# Patient Record
Sex: Male | Born: 1935 | Race: White | Hispanic: No | Marital: Married | Smoking: Current every day smoker
Health system: Southern US, Community
[De-identification: ages and names within clinical notes are randomized; demographics above are authoritative.]

## PROBLEM LIST (undated history)

## (undated) DIAGNOSIS — N189 Chronic kidney disease, unspecified: Secondary | ICD-10-CM

## (undated) DIAGNOSIS — N289 Disorder of kidney and ureter, unspecified: Secondary | ICD-10-CM

## (undated) DIAGNOSIS — G4730 Sleep apnea, unspecified: Secondary | ICD-10-CM

## (undated) DIAGNOSIS — D649 Anemia, unspecified: Secondary | ICD-10-CM

## (undated) DIAGNOSIS — E119 Type 2 diabetes mellitus without complications: Secondary | ICD-10-CM

## (undated) DIAGNOSIS — J449 Chronic obstructive pulmonary disease, unspecified: Secondary | ICD-10-CM

## (undated) DIAGNOSIS — IMO0001 Reserved for inherently not codable concepts without codable children: Secondary | ICD-10-CM

## (undated) DIAGNOSIS — J189 Pneumonia, unspecified organism: Secondary | ICD-10-CM

## (undated) DIAGNOSIS — E611 Iron deficiency: Secondary | ICD-10-CM

## (undated) DIAGNOSIS — Z8719 Personal history of other diseases of the digestive system: Secondary | ICD-10-CM

## (undated) DIAGNOSIS — I10 Essential (primary) hypertension: Secondary | ICD-10-CM

## (undated) DIAGNOSIS — C801 Malignant (primary) neoplasm, unspecified: Secondary | ICD-10-CM

## (undated) HISTORY — PX: KIDNEY SURGERY: SHX687

## (undated) HISTORY — PX: EYE SURGERY: SHX253

## (undated) HISTORY — PX: COLONOSCOPY: SHX174

---

## 2008-07-26 ENCOUNTER — Encounter (INDEPENDENT_AMBULATORY_CARE_PROVIDER_SITE_OTHER): Payer: Self-pay | Admitting: Interventional Radiology

## 2008-07-26 ENCOUNTER — Ambulatory Visit (HOSPITAL_COMMUNITY): Admission: RE | Admit: 2008-07-26 | Discharge: 2008-07-26 | Payer: Self-pay | Admitting: Urology

## 2010-06-19 ENCOUNTER — Inpatient Hospital Stay (HOSPITAL_COMMUNITY): Admission: EM | Admit: 2010-06-19 | Discharge: 2010-06-23 | Payer: Self-pay | Admitting: Emergency Medicine

## 2010-06-20 ENCOUNTER — Encounter (INDEPENDENT_AMBULATORY_CARE_PROVIDER_SITE_OTHER): Payer: Self-pay | Admitting: Internal Medicine

## 2011-03-15 LAB — LIPID PANEL
Cholesterol: 108 mg/dL (ref 0–200)
HDL: 28 mg/dL — ABNORMAL LOW (ref >39)
LDL Cholesterol: 36 mg/dL (ref 0–99)
Total CHOL/HDL Ratio: 3.9 RATIO
Triglycerides: 222 mg/dL — ABNORMAL HIGH (ref <150)

## 2011-03-15 LAB — GLUCOSE, CAPILLARY
Glucose-Capillary: 179 mg/dL — ABNORMAL HIGH (ref 70–99)
Glucose-Capillary: 199 mg/dL — ABNORMAL HIGH (ref 70–99)
Glucose-Capillary: 214 mg/dL — ABNORMAL HIGH (ref 70–99)
Glucose-Capillary: 222 mg/dL — ABNORMAL HIGH (ref 70–99)
Glucose-Capillary: 254 mg/dL — ABNORMAL HIGH (ref 70–99)
Glucose-Capillary: 267 mg/dL — ABNORMAL HIGH (ref 70–99)
Glucose-Capillary: 279 mg/dL — ABNORMAL HIGH (ref 70–99)
Glucose-Capillary: 298 mg/dL — ABNORMAL HIGH (ref 70–99)
Glucose-Capillary: 306 mg/dL — ABNORMAL HIGH (ref 70–99)
Glucose-Capillary: 311 mg/dL — ABNORMAL HIGH (ref 70–99)
Glucose-Capillary: 312 mg/dL — ABNORMAL HIGH (ref 70–99)
Glucose-Capillary: 327 mg/dL — ABNORMAL HIGH (ref 70–99)
Glucose-Capillary: 364 mg/dL — ABNORMAL HIGH (ref 70–99)
Glucose-Capillary: 368 mg/dL — ABNORMAL HIGH (ref 70–99)
Glucose-Capillary: 374 mg/dL — ABNORMAL HIGH (ref 70–99)
Glucose-Capillary: 374 mg/dL — ABNORMAL HIGH (ref 70–99)
Glucose-Capillary: 403 mg/dL — ABNORMAL HIGH (ref 70–99)
Glucose-Capillary: 425 mg/dL — ABNORMAL HIGH (ref 70–99)
Glucose-Capillary: 428 mg/dL — ABNORMAL HIGH (ref 70–99)
Glucose-Capillary: 430 mg/dL — ABNORMAL HIGH (ref 70–99)

## 2011-03-15 LAB — HEMOGLOBIN A1C
Hgb A1c MFr Bld: 9.7 % — ABNORMAL HIGH
Mean Plasma Glucose: 232 mg/dL — ABNORMAL HIGH

## 2011-03-15 LAB — COMPREHENSIVE METABOLIC PANEL WITH GFR
ALT: 25 U/L (ref 0–53)
AST: 20 U/L (ref 0–37)
Albumin: 2.6 g/dL — ABNORMAL LOW (ref 3.5–5.2)
Alkaline Phosphatase: 53 U/L (ref 39–117)
BUN: 34 mg/dL — ABNORMAL HIGH (ref 6–23)
CO2: 27 meq/L (ref 19–32)
Calcium: 8.6 mg/dL (ref 8.4–10.5)
Chloride: 104 meq/L (ref 96–112)
Creatinine, Ser: 1.98 mg/dL — ABNORMAL HIGH (ref 0.4–1.5)
GFR calc non Af Amer: 33 mL/min — ABNORMAL LOW
Glucose, Bld: 397 mg/dL — ABNORMAL HIGH (ref 70–99)
Potassium: 6 meq/L — ABNORMAL HIGH (ref 3.5–5.1)
Sodium: 136 meq/L (ref 135–145)
Total Bilirubin: 0.4 mg/dL (ref 0.3–1.2)
Total Protein: 6.1 g/dL (ref 6.0–8.3)

## 2011-03-15 LAB — CK TOTAL AND CKMB (NOT AT ARMC)
CK, MB: 2 ng/mL (ref 0.3–4.0)
Relative Index: INVALID (ref 0.0–2.5)
Total CK: 31 U/L (ref 7–232)

## 2011-03-15 LAB — CBC
HCT: 26.4 % — ABNORMAL LOW (ref 39.0–52.0)
HCT: 28.3 % — ABNORMAL LOW (ref 39.0–52.0)
HCT: 29 % — ABNORMAL LOW (ref 39.0–52.0)
Hemoglobin: 8.4 g/dL — ABNORMAL LOW (ref 13.0–17.0)
Hemoglobin: 9 g/dL — ABNORMAL LOW (ref 13.0–17.0)
Hemoglobin: 9.5 g/dL — ABNORMAL LOW (ref 13.0–17.0)
MCH: 24.7 pg — ABNORMAL LOW (ref 26.0–34.0)
MCH: 24.9 pg — ABNORMAL LOW (ref 26.0–34.0)
MCH: 25 pg — ABNORMAL LOW (ref 26.0–34.0)
MCH: 25.5 pg — ABNORMAL LOW (ref 26.0–34.0)
MCHC: 31.5 g/dL (ref 30.0–36.0)
MCHC: 31.7 g/dL (ref 30.0–36.0)
MCHC: 32.8 g/dL (ref 30.0–36.0)
MCV: 77.8 fL — ABNORMAL LOW (ref 78.0–100.0)
MCV: 78.3 fL (ref 78.0–100.0)
MCV: 78.5 fL (ref 78.0–100.0)
MCV: 78.9 fL (ref 78.0–100.0)
Platelets: 237 10*3/uL (ref 150–400)
Platelets: 253 10*3/uL (ref 150–400)
Platelets: 258 10*3/uL (ref 150–400)
Platelets: 320 10*3/uL (ref 150–400)
RBC: 3.34 MIL/uL — ABNORMAL LOW (ref 4.22–5.81)
RBC: 3.61 MIL/uL — ABNORMAL LOW (ref 4.22–5.81)
RBC: 3.73 MIL/uL — ABNORMAL LOW (ref 4.22–5.81)
RDW: 18 % — ABNORMAL HIGH (ref 11.5–15.5)
RDW: 18 % — ABNORMAL HIGH (ref 11.5–15.5)
RDW: 18.2 % — ABNORMAL HIGH (ref 11.5–15.5)
WBC: 13.3 10*3/uL — ABNORMAL HIGH (ref 4.0–10.5)
WBC: 13.9 10*3/uL — ABNORMAL HIGH (ref 4.0–10.5)
WBC: 8.9 10*3/uL (ref 4.0–10.5)

## 2011-03-15 LAB — DIFFERENTIAL
Basophils Absolute: 0 10*3/uL (ref 0.0–0.1)
Basophils Relative: 0 % (ref 0–1)
Basophils Relative: 0 % (ref 0–1)
Eosinophils Absolute: 0 10*3/uL (ref 0.0–0.7)
Eosinophils Absolute: 0.6 10*3/uL (ref 0.0–0.7)
Eosinophils Relative: 0 % (ref 0–5)
Eosinophils Relative: 4 % (ref 0–5)
Lymphocytes Relative: 4 % — ABNORMAL LOW (ref 12–46)
Lymphocytes Relative: 4 % — ABNORMAL LOW (ref 12–46)
Lymphs Abs: 0.6 10*3/uL — ABNORMAL LOW (ref 0.7–4.0)
Lymphs Abs: 0.7 10*3/uL (ref 0.7–4.0)
Monocytes Absolute: 0.1 10*3/uL (ref 0.1–1.0)
Monocytes Absolute: 0.4 10*3/uL (ref 0.1–1.0)
Monocytes Relative: 0 % — ABNORMAL LOW (ref 3–12)
Neutro Abs: 12.7 10*3/uL — ABNORMAL HIGH (ref 1.7–7.7)
Neutrophils Relative %: 95 % — ABNORMAL HIGH (ref 43–77)

## 2011-03-15 LAB — BASIC METABOLIC PANEL
BUN: 36 mg/dL — ABNORMAL HIGH (ref 6–23)
CO2: 24 mEq/L (ref 19–32)
CO2: 25 mEq/L (ref 19–32)
CO2: 27 mEq/L (ref 19–32)
Calcium: 8.5 mg/dL (ref 8.4–10.5)
Chloride: 102 mEq/L (ref 96–112)
Creatinine, Ser: 1.93 mg/dL — ABNORMAL HIGH (ref 0.4–1.5)
GFR calc Af Amer: 41 mL/min — ABNORMAL LOW (ref >60)
GFR calc Af Amer: 42 mL/min — ABNORMAL LOW (ref >60)
GFR calc non Af Amer: 32 mL/min — ABNORMAL LOW (ref >60)
Glucose, Bld: 389 mg/dL — ABNORMAL HIGH (ref 70–99)
Potassium: 4.1 mEq/L (ref 3.5–5.1)
Potassium: 4.8 mEq/L (ref 3.5–5.1)
Sodium: 136 mEq/L (ref 135–145)
Sodium: 138 mEq/L (ref 135–145)

## 2011-03-15 LAB — ABO/RH: ABO/RH(D): A POS

## 2011-03-15 LAB — POCT I-STAT, CHEM 8
BUN: 45 mg/dL — ABNORMAL HIGH (ref 6–23)
Calcium, Ion: 1.18 mmol/L (ref 1.12–1.32)
Chloride: 104 mEq/L (ref 96–112)
Sodium: 131 mEq/L — ABNORMAL LOW (ref 135–145)

## 2011-03-15 LAB — COMPREHENSIVE METABOLIC PANEL
AST: 19 U/L (ref 0–37)
BUN: 42 mg/dL — ABNORMAL HIGH (ref 6–23)
CO2: 23 mEq/L (ref 19–32)
Chloride: 100 mEq/L (ref 96–112)
Creatinine, Ser: 2.1 mg/dL — ABNORMAL HIGH (ref 0.4–1.5)
GFR calc Af Amer: 38 mL/min — ABNORMAL LOW (ref >60)
GFR calc non Af Amer: 31 mL/min — ABNORMAL LOW (ref >60)
Glucose, Bld: 298 mg/dL — ABNORMAL HIGH (ref 70–99)
Total Bilirubin: 0.5 mg/dL (ref 0.3–1.2)

## 2011-03-15 LAB — FOLATE: Folate: 9.6 ng/mL

## 2011-03-15 LAB — IRON AND TIBC
Iron: 13 ug/dL — ABNORMAL LOW (ref 42–135)
Saturation Ratios: 5 % — ABNORMAL LOW (ref 20–55)
TIBC: 246 ug/dL (ref 215–435)
UIBC: 233 ug/dL

## 2011-03-15 LAB — URINE MICROSCOPIC-ADD ON

## 2011-03-15 LAB — POCT CARDIAC MARKERS
CKMB, poc: 1.1 ng/mL (ref 1.0–8.0)
Myoglobin, poc: 136 ng/mL (ref 12–200)
Myoglobin, poc: 197 ng/mL (ref 12–200)
Troponin i, poc: <0.05 ng/mL (ref 0.00–0.09)

## 2011-03-15 LAB — CULTURE, BLOOD (ROUTINE X 2)
Culture: NO GROWTH
Culture: NO GROWTH

## 2011-03-15 LAB — OSMOLALITY, URINE: Osmolality, Ur: 568 mOsm/kg (ref 390–1090)

## 2011-03-15 LAB — SODIUM, URINE, RANDOM: Sodium, Ur: 75 mEq/L

## 2011-03-15 LAB — URINALYSIS, ROUTINE W REFLEX MICROSCOPIC
Leukocytes, UA: NEGATIVE
Nitrite: NEGATIVE
Protein, ur: 30 mg/dL — AB
Specific Gravity, Urine: 1.018 (ref 1.005–1.030)
Urobilinogen, UA: 0.2 mg/dL (ref 0.0–1.0)

## 2011-03-15 LAB — VITAMIN B12: Vitamin B-12: 586 pg/mL (ref 211–911)

## 2011-03-15 LAB — FERRITIN: Ferritin: 103 ng/mL (ref 22–322)

## 2011-03-15 LAB — TSH: TSH: 0.81 u[IU]/mL (ref 0.350–4.500)

## 2011-03-15 LAB — LACTIC ACID, PLASMA: Lactic Acid, Venous: 2.7 mmol/L — ABNORMAL HIGH (ref 0.5–2.2)

## 2011-03-15 LAB — TYPE AND SCREEN

## 2011-03-15 LAB — BLOOD GAS, ARTERIAL
O2 Saturation: 97.7 %
Patient temperature: 98.6
TCO2: 19.6 mmol/L (ref 0–100)

## 2011-03-15 LAB — RETICULOCYTES
RBC.: 3.72 MIL/uL — ABNORMAL LOW (ref 4.22–5.81)
Retic Ct Pct: 1.2 % (ref 0.4–3.1)

## 2011-03-15 LAB — CARDIAC PANEL(CRET KIN+CKTOT+MB+TROPI): Total CK: 39 U/L (ref 7–232)

## 2011-03-15 LAB — URINE CULTURE

## 2011-03-15 LAB — VANCOMYCIN, TROUGH: Vancomycin Tr: 16.6 ug/mL (ref 10.0–20.0)

## 2011-03-15 LAB — BRAIN NATRIURETIC PEPTIDE: Pro B Natriuretic peptide (BNP): <30 pg/mL (ref 0.0–100.0)

## 2011-03-15 LAB — CREATININE, URINE, RANDOM: Creatinine, Urine: 84.4 mg/dL

## 2011-03-15 LAB — MAGNESIUM: Magnesium: 2.3 mg/dL (ref 1.5–2.5)

## 2011-09-25 LAB — CBC
HCT: 37.5 — ABNORMAL LOW
Hemoglobin: 12.5 — ABNORMAL LOW
MCHC: 33.4
MCV: 84.6
Platelets: 230
RBC: 4.44
RDW: 15.4
WBC: 9.2

## 2011-09-25 LAB — PROTIME-INR
INR: 1
Prothrombin Time: 12.9

## 2013-07-03 ENCOUNTER — Other Ambulatory Visit (HOSPITAL_COMMUNITY): Payer: Self-pay | Admitting: *Deleted

## 2013-07-04 ENCOUNTER — Encounter (HOSPITAL_COMMUNITY): Payer: Self-pay

## 2013-08-02 ENCOUNTER — Encounter (HOSPITAL_COMMUNITY)
Admission: RE | Admit: 2013-08-02 | Discharge: 2013-08-02 | Disposition: A | Discharge status: Home / Self Care | Payer: Medicare Other | Source: Ambulatory Visit | Attending: Nephrology | Admitting: Nephrology

## 2013-08-02 DIAGNOSIS — D649 Anemia, unspecified: Secondary | ICD-10-CM | POA: Insufficient documentation

## 2013-08-02 MED ORDER — SODIUM CHLORIDE 0.9 % IV SOLN
1020.0000 mg | Freq: Once | INTRAVENOUS | Status: AC
  Administered 2013-08-02: 1020 mg via INTRAVENOUS
  Filled 2013-08-02: qty 34

## 2015-04-16 ENCOUNTER — Ambulatory Visit (HOSPITAL_COMMUNITY)
Admission: RE | Admit: 2015-04-16 | Discharge: 2015-04-16 | Disposition: A | Discharge status: Home / Self Care | Payer: Medicare HMO | Source: Ambulatory Visit | Attending: Nephrology | Admitting: Nephrology

## 2015-04-16 DIAGNOSIS — N184 Chronic kidney disease, stage 4 (severe): Secondary | ICD-10-CM | POA: Diagnosis not present

## 2015-04-16 DIAGNOSIS — Z5181 Encounter for therapeutic drug level monitoring: Secondary | ICD-10-CM | POA: Diagnosis not present

## 2015-04-16 DIAGNOSIS — Z79899 Other long term (current) drug therapy: Secondary | ICD-10-CM | POA: Diagnosis not present

## 2015-04-16 DIAGNOSIS — D631 Anemia in chronic kidney disease: Secondary | ICD-10-CM | POA: Insufficient documentation

## 2015-04-16 LAB — BASIC METABOLIC PANEL
Anion gap: 10 (ref 5–15)
BUN: 31 mg/dL — AB (ref 6–23)
CALCIUM: 8.7 mg/dL (ref 8.4–10.5)
CO2: 27 mmol/L (ref 19–32)
Chloride: 100 mmol/L (ref 96–112)
Creatinine, Ser: 4.03 mg/dL — ABNORMAL HIGH (ref 0.50–1.35)
GFR calc Af Amer: 15 mL/min — ABNORMAL LOW (ref >90)
GFR calc non Af Amer: 13 mL/min — ABNORMAL LOW (ref >90)
GLUCOSE: 210 mg/dL — AB (ref 70–99)
Potassium: 4.1 mmol/L (ref 3.5–5.1)
Sodium: 137 mmol/L (ref 135–145)

## 2015-04-16 LAB — POCT HEMOGLOBIN-HEMACUE: Hemoglobin: 9.3 g/dL — ABNORMAL LOW (ref 13.0–17.0)

## 2015-04-16 MED ORDER — EPOETIN ALFA 10000 UNIT/ML IJ SOLN
INTRAMUSCULAR | Status: AC
  Filled 2015-04-16: qty 1

## 2015-04-16 MED ORDER — EPOETIN ALFA 10000 UNIT/ML IJ SOLN
10000.0000 [IU] | INTRAMUSCULAR | Status: DC
  Administered 2015-04-16: 10000 [IU] via SUBCUTANEOUS

## 2015-04-19 ENCOUNTER — Ambulatory Visit
Admission: RE | Admit: 2015-04-19 | Discharge: 2015-04-19 | Disposition: A | Discharge status: Home / Self Care | Payer: Medicare HMO | Source: Ambulatory Visit | Attending: Nephrology | Admitting: Nephrology

## 2015-04-19 ENCOUNTER — Other Ambulatory Visit: Payer: Self-pay | Admitting: Nephrology

## 2015-04-19 DIAGNOSIS — N179 Acute kidney failure, unspecified: Secondary | ICD-10-CM

## 2015-04-30 ENCOUNTER — Encounter (HOSPITAL_COMMUNITY)
Admission: RE | Admit: 2015-04-30 | Discharge: 2015-04-30 | Disposition: A | Discharge status: Home / Self Care | Payer: Medicare HMO | Source: Ambulatory Visit | Attending: Nephrology | Admitting: Nephrology

## 2015-04-30 DIAGNOSIS — D631 Anemia in chronic kidney disease: Secondary | ICD-10-CM | POA: Diagnosis not present

## 2015-04-30 DIAGNOSIS — Z5181 Encounter for therapeutic drug level monitoring: Secondary | ICD-10-CM | POA: Insufficient documentation

## 2015-04-30 DIAGNOSIS — Z79899 Other long term (current) drug therapy: Secondary | ICD-10-CM | POA: Diagnosis not present

## 2015-04-30 DIAGNOSIS — N184 Chronic kidney disease, stage 4 (severe): Secondary | ICD-10-CM | POA: Diagnosis present

## 2015-04-30 LAB — RENAL FUNCTION PANEL
ALBUMIN: 2.8 g/dL — AB (ref 3.5–5.0)
ANION GAP: 12 (ref 5–15)
BUN: 30 mg/dL — ABNORMAL HIGH (ref 6–20)
CHLORIDE: 101 mmol/L (ref 101–111)
CO2: 27 mmol/L (ref 22–32)
Calcium: 8.8 mg/dL — ABNORMAL LOW (ref 8.9–10.3)
Creatinine, Ser: 4.02 mg/dL — ABNORMAL HIGH (ref 0.61–1.24)
GFR calc Af Amer: 15 mL/min — ABNORMAL LOW (ref >60)
GFR, EST NON AFRICAN AMERICAN: 13 mL/min — AB (ref >60)
Glucose, Bld: 216 mg/dL — ABNORMAL HIGH (ref 70–99)
PHOSPHORUS: 4.7 mg/dL — AB (ref 2.5–4.6)
POTASSIUM: 4.5 mmol/L (ref 3.5–5.1)
SODIUM: 140 mmol/L (ref 135–145)

## 2015-04-30 LAB — MAGNESIUM: MAGNESIUM: 2.3 mg/dL (ref 1.7–2.4)

## 2015-04-30 LAB — IRON AND TIBC
Iron: 241 ug/dL — ABNORMAL HIGH (ref 45–182)
Saturation Ratios: 70 % — ABNORMAL HIGH (ref 17.9–39.5)
TIBC: 343 ug/dL (ref 250–450)
UIBC: 102 ug/dL

## 2015-04-30 LAB — FERRITIN: Ferritin: 12 ng/mL — ABNORMAL LOW (ref 24–336)

## 2015-04-30 LAB — POCT HEMOGLOBIN-HEMACUE: Hemoglobin: 9.5 g/dL — ABNORMAL LOW (ref 13.0–17.0)

## 2015-04-30 MED ORDER — EPOETIN ALFA 10000 UNIT/ML IJ SOLN
INTRAMUSCULAR | Status: AC
  Filled 2015-04-30: qty 1

## 2015-04-30 MED ORDER — EPOETIN ALFA 10000 UNIT/ML IJ SOLN
10000.0000 [IU] | INTRAMUSCULAR | Status: DC
  Administered 2015-04-30: 10000 [IU] via SUBCUTANEOUS

## 2015-05-14 ENCOUNTER — Encounter (HOSPITAL_COMMUNITY)
Admission: RE | Admit: 2015-05-14 | Discharge: 2015-05-14 | Disposition: A | Discharge status: Home / Self Care | Payer: Medicare HMO | Source: Ambulatory Visit | Attending: Nephrology | Admitting: Nephrology

## 2015-05-14 DIAGNOSIS — N184 Chronic kidney disease, stage 4 (severe): Secondary | ICD-10-CM | POA: Diagnosis not present

## 2015-05-14 LAB — POCT HEMOGLOBIN-HEMACUE: HEMOGLOBIN: 11.5 g/dL — AB (ref 13.0–17.0)

## 2015-05-14 MED ORDER — EPOETIN ALFA 10000 UNIT/ML IJ SOLN: 10000.0000 [IU] | INTRAMUSCULAR | Status: DC

## 2015-05-28 ENCOUNTER — Encounter (HOSPITAL_COMMUNITY)
Admission: RE | Admit: 2015-05-28 | Discharge: 2015-05-28 | Disposition: A | Discharge status: Home / Self Care | Payer: Medicare HMO | Source: Ambulatory Visit | Attending: Nephrology | Admitting: Nephrology

## 2015-05-28 DIAGNOSIS — N184 Chronic kidney disease, stage 4 (severe): Secondary | ICD-10-CM | POA: Diagnosis not present

## 2015-05-28 DIAGNOSIS — Z5181 Encounter for therapeutic drug level monitoring: Secondary | ICD-10-CM | POA: Insufficient documentation

## 2015-05-28 DIAGNOSIS — D631 Anemia in chronic kidney disease: Secondary | ICD-10-CM | POA: Insufficient documentation

## 2015-05-28 DIAGNOSIS — Z79899 Other long term (current) drug therapy: Secondary | ICD-10-CM | POA: Diagnosis not present

## 2015-05-28 LAB — RENAL FUNCTION PANEL
ALBUMIN: 2.8 g/dL — AB (ref 3.5–5.0)
Anion gap: 10 (ref 5–15)
BUN: 26 mg/dL — AB (ref 6–20)
CALCIUM: 8.7 mg/dL — AB (ref 8.9–10.3)
CO2: 30 mmol/L (ref 22–32)
CREATININE: 3.81 mg/dL — AB (ref 0.61–1.24)
Chloride: 98 mmol/L — ABNORMAL LOW (ref 101–111)
GFR calc Af Amer: 16 mL/min — ABNORMAL LOW (ref >60)
GFR calc non Af Amer: 14 mL/min — ABNORMAL LOW (ref >60)
Glucose, Bld: 250 mg/dL — ABNORMAL HIGH (ref 65–99)
PHOSPHORUS: 4.1 mg/dL (ref 2.5–4.6)
Potassium: 3.7 mmol/L (ref 3.5–5.1)
Sodium: 138 mmol/L (ref 135–145)

## 2015-05-28 LAB — IRON AND TIBC
Iron: 71 ug/dL (ref 45–182)
Saturation Ratios: 23 % (ref 17.9–39.5)
TIBC: 308 ug/dL (ref 250–450)
UIBC: 237 ug/dL

## 2015-05-28 LAB — MAGNESIUM: Magnesium: 2.2 mg/dL (ref 1.7–2.4)

## 2015-05-28 LAB — POCT HEMOGLOBIN-HEMACUE: HEMOGLOBIN: 10.5 g/dL — AB (ref 13.0–17.0)

## 2015-05-28 LAB — FERRITIN: FERRITIN: 17 ng/mL — AB (ref 24–336)

## 2015-05-28 MED ORDER — EPOETIN ALFA 10000 UNIT/ML IJ SOLN
10000.0000 [IU] | INTRAMUSCULAR | Status: DC
Start: 2015-05-28 — End: 2015-05-29
  Administered 2015-05-28: 10000 [IU] via SUBCUTANEOUS

## 2015-05-28 MED ORDER — EPOETIN ALFA 10000 UNIT/ML IJ SOLN
INTRAMUSCULAR | Status: AC
  Filled 2015-05-28: qty 1

## 2015-06-11 ENCOUNTER — Encounter (HOSPITAL_COMMUNITY)
Admission: RE | Admit: 2015-06-11 | Discharge: 2015-06-11 | Disposition: A | Discharge status: Home / Self Care | Payer: Medicare HMO | Source: Ambulatory Visit | Attending: Nephrology | Admitting: Nephrology

## 2015-06-11 DIAGNOSIS — D631 Anemia in chronic kidney disease: Secondary | ICD-10-CM | POA: Insufficient documentation

## 2015-06-11 DIAGNOSIS — Z5181 Encounter for therapeutic drug level monitoring: Secondary | ICD-10-CM | POA: Insufficient documentation

## 2015-06-11 DIAGNOSIS — N184 Chronic kidney disease, stage 4 (severe): Secondary | ICD-10-CM | POA: Diagnosis present

## 2015-06-11 DIAGNOSIS — Z79899 Other long term (current) drug therapy: Secondary | ICD-10-CM | POA: Insufficient documentation

## 2015-06-11 LAB — POCT HEMOGLOBIN-HEMACUE: Hemoglobin: 11.6 g/dL — ABNORMAL LOW (ref 13.0–17.0)

## 2015-06-11 MED ORDER — EPOETIN ALFA 10000 UNIT/ML IJ SOLN: 10000.0000 [IU] | INTRAMUSCULAR | Status: DC

## 2015-06-11 MED ORDER — EPOETIN ALFA 10000 UNIT/ML IJ SOLN
INTRAMUSCULAR | Status: AC
  Filled 2015-06-11: qty 1

## 2015-06-24 ENCOUNTER — Other Ambulatory Visit: Payer: Self-pay | Admitting: Urology

## 2015-06-24 DIAGNOSIS — D49519 Neoplasm of unspecified behavior of unspecified kidney: Secondary | ICD-10-CM

## 2015-06-24 DIAGNOSIS — Q600 Renal agenesis, unilateral: Secondary | ICD-10-CM

## 2015-06-24 DIAGNOSIS — IMO0002 Reserved for concepts with insufficient information to code with codable children: Secondary | ICD-10-CM

## 2015-06-25 ENCOUNTER — Encounter (HOSPITAL_COMMUNITY)
Admission: RE | Admit: 2015-06-25 | Discharge: 2015-06-25 | Disposition: A | Discharge status: Home / Self Care | Payer: Medicare HMO | Source: Ambulatory Visit | Attending: Nephrology | Admitting: Nephrology

## 2015-06-25 DIAGNOSIS — N184 Chronic kidney disease, stage 4 (severe): Secondary | ICD-10-CM | POA: Diagnosis not present

## 2015-06-25 LAB — RENAL FUNCTION PANEL
ANION GAP: 7 (ref 5–15)
Albumin: 2.7 g/dL — ABNORMAL LOW (ref 3.5–5.0)
BUN: 28 mg/dL — AB (ref 6–20)
CHLORIDE: 103 mmol/L (ref 101–111)
CO2: 28 mmol/L (ref 22–32)
Calcium: 8.8 mg/dL — ABNORMAL LOW (ref 8.9–10.3)
Creatinine, Ser: 4.06 mg/dL — ABNORMAL HIGH (ref 0.61–1.24)
GFR calc Af Amer: 15 mL/min — ABNORMAL LOW (ref >60)
GFR calc non Af Amer: 13 mL/min — ABNORMAL LOW (ref >60)
Glucose, Bld: 177 mg/dL — ABNORMAL HIGH (ref 65–99)
POTASSIUM: 4.2 mmol/L (ref 3.5–5.1)
Phosphorus: 4.9 mg/dL — ABNORMAL HIGH (ref 2.5–4.6)
SODIUM: 138 mmol/L (ref 135–145)

## 2015-06-25 LAB — FERRITIN: FERRITIN: 15 ng/mL — AB (ref 24–336)

## 2015-06-25 LAB — IRON AND TIBC
IRON: 81 ug/dL (ref 45–182)
SATURATION RATIOS: 25 % (ref 17.9–39.5)
TIBC: 325 ug/dL (ref 250–450)
UIBC: 244 ug/dL

## 2015-06-25 LAB — MAGNESIUM: MAGNESIUM: 2.2 mg/dL (ref 1.7–2.4)

## 2015-06-25 LAB — POCT HEMOGLOBIN-HEMACUE: Hemoglobin: 9.5 g/dL — ABNORMAL LOW (ref 13.0–17.0)

## 2015-06-25 MED ORDER — EPOETIN ALFA 10000 UNIT/ML IJ SOLN
INTRAMUSCULAR | Status: AC
  Administered 2015-06-25: 10000 [IU] via SUBCUTANEOUS
  Filled 2015-06-25: qty 1

## 2015-06-25 MED ORDER — EPOETIN ALFA 10000 UNIT/ML IJ SOLN
10000.0000 [IU] | INTRAMUSCULAR | Status: DC
Start: 2015-06-25 — End: 2015-06-26
  Administered 2015-06-25: 10000 [IU] via SUBCUTANEOUS

## 2015-07-09 ENCOUNTER — Encounter (HOSPITAL_COMMUNITY)
Admission: RE | Admit: 2015-07-09 | Discharge: 2015-07-09 | Disposition: A | Discharge status: Home / Self Care | Payer: Medicare HMO | Source: Ambulatory Visit | Attending: Nephrology | Admitting: Nephrology

## 2015-07-09 DIAGNOSIS — Z5181 Encounter for therapeutic drug level monitoring: Secondary | ICD-10-CM | POA: Insufficient documentation

## 2015-07-09 DIAGNOSIS — N184 Chronic kidney disease, stage 4 (severe): Secondary | ICD-10-CM | POA: Diagnosis present

## 2015-07-09 DIAGNOSIS — Z79899 Other long term (current) drug therapy: Secondary | ICD-10-CM | POA: Diagnosis not present

## 2015-07-09 DIAGNOSIS — D631 Anemia in chronic kidney disease: Secondary | ICD-10-CM | POA: Insufficient documentation

## 2015-07-09 LAB — POCT HEMOGLOBIN-HEMACUE: Hemoglobin: 9 g/dL — ABNORMAL LOW (ref 13.0–17.0)

## 2015-07-09 MED ORDER — EPOETIN ALFA 20000 UNIT/ML IJ SOLN
20000.0000 [IU] | INTRAMUSCULAR | Status: DC
  Administered 2015-07-09: 20000 [IU] via SUBCUTANEOUS

## 2015-07-09 MED ORDER — EPOETIN ALFA 20000 UNIT/ML IJ SOLN
INTRAMUSCULAR | Status: AC
  Filled 2015-07-09: qty 1

## 2015-07-23 ENCOUNTER — Encounter (HOSPITAL_COMMUNITY)
Admission: RE | Admit: 2015-07-23 | Discharge: 2015-07-23 | Disposition: A | Discharge status: Home / Self Care | Payer: Medicare HMO | Source: Ambulatory Visit | Attending: Nephrology | Admitting: Nephrology

## 2015-07-23 DIAGNOSIS — N184 Chronic kidney disease, stage 4 (severe): Secondary | ICD-10-CM | POA: Diagnosis not present

## 2015-07-23 LAB — IRON AND TIBC
Iron: 157 ug/dL (ref 45–182)
SATURATION RATIOS: 45 % — AB (ref 17.9–39.5)
TIBC: 353 ug/dL (ref 250–450)
UIBC: 196 ug/dL

## 2015-07-23 LAB — POCT HEMOGLOBIN-HEMACUE: Hemoglobin: 9.5 g/dL — ABNORMAL LOW (ref 13.0–17.0)

## 2015-07-23 LAB — FERRITIN: Ferritin: 13 ng/mL — ABNORMAL LOW (ref 24–336)

## 2015-07-23 LAB — RENAL FUNCTION PANEL
ALBUMIN: 2.9 g/dL — AB (ref 3.5–5.0)
Anion gap: 11 (ref 5–15)
BUN: 31 mg/dL — AB (ref 6–20)
CALCIUM: 9.1 mg/dL (ref 8.9–10.3)
CO2: 26 mmol/L (ref 22–32)
Chloride: 99 mmol/L — ABNORMAL LOW (ref 101–111)
Creatinine, Ser: 4.17 mg/dL — ABNORMAL HIGH (ref 0.61–1.24)
GFR calc Af Amer: 15 mL/min — ABNORMAL LOW (ref >60)
GFR calc non Af Amer: 13 mL/min — ABNORMAL LOW (ref >60)
Glucose, Bld: 175 mg/dL — ABNORMAL HIGH (ref 65–99)
POTASSIUM: 4.2 mmol/L (ref 3.5–5.1)
Phosphorus: 4.7 mg/dL — ABNORMAL HIGH (ref 2.5–4.6)
Sodium: 136 mmol/L (ref 135–145)

## 2015-07-23 MED ORDER — EPOETIN ALFA 20000 UNIT/ML IJ SOLN
20000.0000 [IU] | INTRAMUSCULAR | Status: DC
  Administered 2015-07-23: 20000 [IU] via SUBCUTANEOUS

## 2015-07-23 MED ORDER — EPOETIN ALFA 20000 UNIT/ML IJ SOLN
INTRAMUSCULAR | Status: AC
  Filled 2015-07-23: qty 1

## 2015-07-24 LAB — PTH, INTACT AND CALCIUM
Calcium, Total (PTH): 9.3 mg/dL (ref 8.6–10.2)
PTH: 32 pg/mL (ref 15–65)

## 2015-08-06 ENCOUNTER — Encounter (HOSPITAL_COMMUNITY)
Admission: RE | Admit: 2015-08-06 | Discharge: 2015-08-06 | Disposition: A | Discharge status: Home / Self Care | Payer: Medicare HMO | Source: Ambulatory Visit | Attending: Nephrology | Admitting: Nephrology

## 2015-08-06 DIAGNOSIS — N184 Chronic kidney disease, stage 4 (severe): Secondary | ICD-10-CM | POA: Insufficient documentation

## 2015-08-06 DIAGNOSIS — Z79899 Other long term (current) drug therapy: Secondary | ICD-10-CM | POA: Diagnosis not present

## 2015-08-06 DIAGNOSIS — D631 Anemia in chronic kidney disease: Secondary | ICD-10-CM | POA: Diagnosis not present

## 2015-08-06 DIAGNOSIS — Z5181 Encounter for therapeutic drug level monitoring: Secondary | ICD-10-CM | POA: Diagnosis not present

## 2015-08-06 MED ORDER — EPOETIN ALFA 20000 UNIT/ML IJ SOLN
INTRAMUSCULAR | Status: AC
  Filled 2015-08-06: qty 1

## 2015-08-06 MED ORDER — EPOETIN ALFA 20000 UNIT/ML IJ SOLN
20000.0000 [IU] | INTRAMUSCULAR | Status: DC
  Administered 2015-08-06: 20000 [IU] via SUBCUTANEOUS

## 2015-08-07 LAB — POCT HEMOGLOBIN-HEMACUE: Hemoglobin: 10.1 g/dL — ABNORMAL LOW (ref 13.0–17.0)

## 2015-08-20 ENCOUNTER — Encounter (HOSPITAL_COMMUNITY)
Admission: RE | Admit: 2015-08-20 | Discharge: 2015-08-20 | Disposition: A | Discharge status: Home / Self Care | Payer: Medicare HMO | Source: Ambulatory Visit | Attending: Nephrology | Admitting: Nephrology

## 2015-08-20 DIAGNOSIS — N184 Chronic kidney disease, stage 4 (severe): Secondary | ICD-10-CM | POA: Diagnosis not present

## 2015-08-20 LAB — RENAL FUNCTION PANEL
Albumin: 3.1 g/dL — ABNORMAL LOW (ref 3.5–5.0)
Anion gap: 7 (ref 5–15)
BUN: 40 mg/dL — ABNORMAL HIGH (ref 6–20)
CHLORIDE: 103 mmol/L (ref 101–111)
CO2: 25 mmol/L (ref 22–32)
Calcium: 8.9 mg/dL (ref 8.9–10.3)
Creatinine, Ser: 4.13 mg/dL — ABNORMAL HIGH (ref 0.61–1.24)
GFR, EST AFRICAN AMERICAN: 15 mL/min — AB (ref >60)
GFR, EST NON AFRICAN AMERICAN: 13 mL/min — AB (ref >60)
Glucose, Bld: 194 mg/dL — ABNORMAL HIGH (ref 65–99)
PHOSPHORUS: 4.7 mg/dL — AB (ref 2.5–4.6)
Potassium: 4.5 mmol/L (ref 3.5–5.1)
SODIUM: 135 mmol/L (ref 135–145)

## 2015-08-20 LAB — IRON AND TIBC
IRON: 92 ug/dL (ref 45–182)
Saturation Ratios: 24 % (ref 17.9–39.5)
TIBC: 382 ug/dL (ref 250–450)
UIBC: 290 ug/dL

## 2015-08-20 LAB — FERRITIN: Ferritin: 15 ng/mL — ABNORMAL LOW (ref 24–336)

## 2015-08-20 LAB — POCT HEMOGLOBIN-HEMACUE: Hemoglobin: 9.5 g/dL — ABNORMAL LOW (ref 13.0–17.0)

## 2015-08-20 MED ORDER — EPOETIN ALFA 20000 UNIT/ML IJ SOLN
20000.0000 [IU] | INTRAMUSCULAR | Status: DC
  Administered 2015-08-20: 20000 [IU] via SUBCUTANEOUS

## 2015-08-20 MED ORDER — EPOETIN ALFA 20000 UNIT/ML IJ SOLN
INTRAMUSCULAR | Status: AC
  Filled 2015-08-20: qty 1

## 2015-08-21 LAB — PTH, INTACT AND CALCIUM
Calcium, Total (PTH): 8.8 mg/dL (ref 8.6–10.2)
PTH: 50 pg/mL (ref 15–65)

## 2015-09-03 ENCOUNTER — Encounter (HOSPITAL_COMMUNITY)
Admission: RE | Admit: 2015-09-03 | Discharge: 2015-09-03 | Disposition: A | Discharge status: Home / Self Care | Payer: Medicare HMO | Source: Ambulatory Visit | Attending: Nephrology | Admitting: Nephrology

## 2015-09-03 DIAGNOSIS — Z79899 Other long term (current) drug therapy: Secondary | ICD-10-CM | POA: Diagnosis not present

## 2015-09-03 DIAGNOSIS — Z5181 Encounter for therapeutic drug level monitoring: Secondary | ICD-10-CM | POA: Diagnosis not present

## 2015-09-03 DIAGNOSIS — D631 Anemia in chronic kidney disease: Secondary | ICD-10-CM | POA: Insufficient documentation

## 2015-09-03 DIAGNOSIS — N184 Chronic kidney disease, stage 4 (severe): Secondary | ICD-10-CM | POA: Diagnosis present

## 2015-09-03 LAB — POCT HEMOGLOBIN-HEMACUE: HEMOGLOBIN: 10.2 g/dL — AB (ref 13.0–17.0)

## 2015-09-03 MED ORDER — EPOETIN ALFA 20000 UNIT/ML IJ SOLN
INTRAMUSCULAR | Status: AC
  Filled 2015-09-03: qty 1

## 2015-09-03 MED ORDER — EPOETIN ALFA 20000 UNIT/ML IJ SOLN
20000.0000 [IU] | INTRAMUSCULAR | Status: DC
  Administered 2015-09-03: 20000 [IU] via SUBCUTANEOUS

## 2015-09-10 ENCOUNTER — Ambulatory Visit
Admission: RE | Admit: 2015-09-10 | Discharge: 2015-09-10 | Disposition: A | Discharge status: Home / Self Care | Payer: Medicare HMO | Source: Ambulatory Visit | Attending: Urology | Admitting: Urology

## 2015-09-10 DIAGNOSIS — IMO0002 Reserved for concepts with insufficient information to code with codable children: Secondary | ICD-10-CM

## 2015-09-10 DIAGNOSIS — Q600 Renal agenesis, unilateral: Secondary | ICD-10-CM

## 2015-09-10 DIAGNOSIS — D49519 Neoplasm of unspecified behavior of unspecified kidney: Secondary | ICD-10-CM

## 2015-09-17 ENCOUNTER — Ambulatory Visit (HOSPITAL_COMMUNITY)
Admission: RE | Admit: 2015-09-17 | Discharge: 2015-09-17 | Disposition: A | Discharge status: Home / Self Care | Payer: Medicare HMO | Source: Ambulatory Visit | Attending: Nephrology | Admitting: Nephrology

## 2015-09-17 DIAGNOSIS — N184 Chronic kidney disease, stage 4 (severe): Secondary | ICD-10-CM | POA: Insufficient documentation

## 2015-09-17 DIAGNOSIS — D631 Anemia in chronic kidney disease: Secondary | ICD-10-CM | POA: Insufficient documentation

## 2015-09-17 DIAGNOSIS — Z79899 Other long term (current) drug therapy: Secondary | ICD-10-CM | POA: Insufficient documentation

## 2015-09-17 DIAGNOSIS — Z5181 Encounter for therapeutic drug level monitoring: Secondary | ICD-10-CM | POA: Insufficient documentation

## 2015-09-17 LAB — IRON AND TIBC
Iron: 79 ug/dL (ref 45–182)
SATURATION RATIOS: 23 % (ref 17.9–39.5)
TIBC: 346 ug/dL (ref 250–450)
UIBC: 267 ug/dL

## 2015-09-17 LAB — FERRITIN: Ferritin: 15 ng/mL — ABNORMAL LOW (ref 24–336)

## 2015-09-17 LAB — RENAL FUNCTION PANEL
ALBUMIN: 3.1 g/dL — AB (ref 3.5–5.0)
ANION GAP: 12 (ref 5–15)
BUN: 48 mg/dL — ABNORMAL HIGH (ref 6–20)
CALCIUM: 9.2 mg/dL (ref 8.9–10.3)
CO2: 26 mmol/L (ref 22–32)
Chloride: 98 mmol/L — ABNORMAL LOW (ref 101–111)
Creatinine, Ser: 4.51 mg/dL — ABNORMAL HIGH (ref 0.61–1.24)
GFR calc Af Amer: 13 mL/min — ABNORMAL LOW (ref >60)
GFR calc non Af Amer: 11 mL/min — ABNORMAL LOW (ref >60)
GLUCOSE: 210 mg/dL — AB (ref 65–99)
PHOSPHORUS: 5.6 mg/dL — AB (ref 2.5–4.6)
Potassium: 4.8 mmol/L (ref 3.5–5.1)
Sodium: 136 mmol/L (ref 135–145)

## 2015-09-17 LAB — POCT HEMOGLOBIN-HEMACUE: Hemoglobin: 10.1 g/dL — ABNORMAL LOW (ref 13.0–17.0)

## 2015-09-17 MED ORDER — EPOETIN ALFA 20000 UNIT/ML IJ SOLN
20000.0000 [IU] | INTRAMUSCULAR | Status: DC
  Administered 2015-09-17: 20000 [IU] via SUBCUTANEOUS

## 2015-09-17 MED ORDER — EPOETIN ALFA 20000 UNIT/ML IJ SOLN
INTRAMUSCULAR | Status: AC
  Filled 2015-09-17: qty 1

## 2015-09-19 LAB — PTH, INTACT AND CALCIUM
Calcium, Total (PTH): 9.2 mg/dL (ref 8.6–10.2)
PTH: 31 pg/mL (ref 15–65)

## 2015-10-01 ENCOUNTER — Encounter (HOSPITAL_COMMUNITY)
Admission: RE | Admit: 2015-10-01 | Discharge: 2015-10-01 | Disposition: A | Discharge status: Home / Self Care | Payer: Medicare HMO | Source: Ambulatory Visit | Attending: Nephrology | Admitting: Nephrology

## 2015-10-01 DIAGNOSIS — D631 Anemia in chronic kidney disease: Secondary | ICD-10-CM | POA: Diagnosis not present

## 2015-10-01 DIAGNOSIS — Z79899 Other long term (current) drug therapy: Secondary | ICD-10-CM | POA: Insufficient documentation

## 2015-10-01 DIAGNOSIS — Z5181 Encounter for therapeutic drug level monitoring: Secondary | ICD-10-CM | POA: Diagnosis not present

## 2015-10-01 DIAGNOSIS — N184 Chronic kidney disease, stage 4 (severe): Secondary | ICD-10-CM | POA: Insufficient documentation

## 2015-10-01 LAB — POCT HEMOGLOBIN-HEMACUE: HEMOGLOBIN: 9.9 g/dL — AB (ref 13.0–17.0)

## 2015-10-01 MED ORDER — EPOETIN ALFA 20000 UNIT/ML IJ SOLN
20000.0000 [IU] | INTRAMUSCULAR | Status: DC
  Administered 2015-10-01: 20000 [IU] via SUBCUTANEOUS

## 2015-10-01 MED ORDER — EPOETIN ALFA 20000 UNIT/ML IJ SOLN
INTRAMUSCULAR | Status: DC
Start: 2015-10-01 — End: 2015-10-02
  Filled 2015-10-01: qty 1

## 2015-10-15 ENCOUNTER — Encounter (HOSPITAL_COMMUNITY)
Admission: RE | Admit: 2015-10-15 | Discharge: 2015-10-15 | Disposition: A | Discharge status: Home / Self Care | Payer: Medicare HMO | Source: Ambulatory Visit | Attending: Nephrology | Admitting: Nephrology

## 2015-10-15 DIAGNOSIS — N184 Chronic kidney disease, stage 4 (severe): Secondary | ICD-10-CM | POA: Diagnosis not present

## 2015-10-15 LAB — RENAL FUNCTION PANEL
ALBUMIN: 3.1 g/dL — AB (ref 3.5–5.0)
ANION GAP: 11 (ref 5–15)
BUN: 53 mg/dL — ABNORMAL HIGH (ref 6–20)
CALCIUM: 9.2 mg/dL (ref 8.9–10.3)
CO2: 25 mmol/L (ref 22–32)
Chloride: 101 mmol/L (ref 101–111)
Creatinine, Ser: 4.67 mg/dL — ABNORMAL HIGH (ref 0.61–1.24)
GFR calc non Af Amer: 11 mL/min — ABNORMAL LOW (ref >60)
GFR, EST AFRICAN AMERICAN: 13 mL/min — AB (ref >60)
GLUCOSE: 118 mg/dL — AB (ref 65–99)
PHOSPHORUS: 6.5 mg/dL — AB (ref 2.5–4.6)
Potassium: 3.9 mmol/L (ref 3.5–5.1)
SODIUM: 137 mmol/L (ref 135–145)

## 2015-10-15 LAB — FERRITIN: Ferritin: 21 ng/mL — ABNORMAL LOW (ref 24–336)

## 2015-10-15 LAB — IRON AND TIBC
Iron: 27 ug/dL — ABNORMAL LOW (ref 45–182)
SATURATION RATIOS: 8 % — AB (ref 17.9–39.5)
TIBC: 339 ug/dL (ref 250–450)
UIBC: 312 ug/dL

## 2015-10-15 LAB — POCT HEMOGLOBIN-HEMACUE: Hemoglobin: 9.9 g/dL — ABNORMAL LOW (ref 13.0–17.0)

## 2015-10-15 MED ORDER — EPOETIN ALFA 20000 UNIT/ML IJ SOLN
20000.0000 [IU] | INTRAMUSCULAR | Status: DC
Start: 2015-10-15 — End: 2015-10-16
  Administered 2015-10-15: 20000 [IU] via SUBCUTANEOUS

## 2015-10-15 MED ORDER — EPOETIN ALFA 20000 UNIT/ML IJ SOLN
INTRAMUSCULAR | Status: AC
  Administered 2015-10-15: 20000 [IU] via SUBCUTANEOUS
  Filled 2015-10-15: qty 1

## 2015-10-16 LAB — PTH, INTACT AND CALCIUM
CALCIUM TOTAL (PTH): 9.1 mg/dL (ref 8.6–10.2)
PTH: 26 pg/mL (ref 15–65)

## 2015-10-28 ENCOUNTER — Other Ambulatory Visit (HOSPITAL_COMMUNITY): Payer: Self-pay | Admitting: *Deleted

## 2015-10-29 ENCOUNTER — Encounter (HOSPITAL_COMMUNITY)
Admission: RE | Admit: 2015-10-29 | Discharge: 2015-10-29 | Disposition: A | Discharge status: Home / Self Care | Payer: Medicare HMO | Source: Ambulatory Visit | Attending: Nephrology | Admitting: Nephrology

## 2015-10-29 ENCOUNTER — Emergency Department (HOSPITAL_COMMUNITY)
Admission: EM | Admit: 2015-10-29 | Discharge: 2015-10-29 | Disposition: A | Discharge status: Home / Self Care | Payer: Medicare HMO | Attending: Emergency Medicine | Admitting: Emergency Medicine

## 2015-10-29 ENCOUNTER — Encounter (HOSPITAL_COMMUNITY): Payer: Self-pay | Admitting: Cardiology

## 2015-10-29 ENCOUNTER — Other Ambulatory Visit: Payer: Self-pay

## 2015-10-29 ENCOUNTER — Emergency Department (HOSPITAL_COMMUNITY): Payer: Medicare HMO

## 2015-10-29 DIAGNOSIS — D631 Anemia in chronic kidney disease: Secondary | ICD-10-CM | POA: Insufficient documentation

## 2015-10-29 DIAGNOSIS — N189 Chronic kidney disease, unspecified: Secondary | ICD-10-CM | POA: Insufficient documentation

## 2015-10-29 DIAGNOSIS — I129 Hypertensive chronic kidney disease with stage 1 through stage 4 chronic kidney disease, or unspecified chronic kidney disease: Secondary | ICD-10-CM | POA: Insufficient documentation

## 2015-10-29 DIAGNOSIS — R0602 Shortness of breath: Secondary | ICD-10-CM

## 2015-10-29 DIAGNOSIS — E119 Type 2 diabetes mellitus without complications: Secondary | ICD-10-CM | POA: Diagnosis not present

## 2015-10-29 DIAGNOSIS — R600 Localized edema: Secondary | ICD-10-CM | POA: Insufficient documentation

## 2015-10-29 DIAGNOSIS — T454X5A Adverse effect of iron and its compounds, initial encounter: Secondary | ICD-10-CM | POA: Insufficient documentation

## 2015-10-29 DIAGNOSIS — F17210 Nicotine dependence, cigarettes, uncomplicated: Secondary | ICD-10-CM | POA: Diagnosis not present

## 2015-10-29 DIAGNOSIS — Z79899 Other long term (current) drug therapy: Secondary | ICD-10-CM | POA: Insufficient documentation

## 2015-10-29 DIAGNOSIS — R1110 Vomiting, unspecified: Secondary | ICD-10-CM | POA: Insufficient documentation

## 2015-10-29 DIAGNOSIS — Z5181 Encounter for therapeutic drug level monitoring: Secondary | ICD-10-CM | POA: Diagnosis not present

## 2015-10-29 DIAGNOSIS — Z87448 Personal history of other diseases of urinary system: Secondary | ICD-10-CM | POA: Diagnosis not present

## 2015-10-29 DIAGNOSIS — Z794 Long term (current) use of insulin: Secondary | ICD-10-CM | POA: Insufficient documentation

## 2015-10-29 DIAGNOSIS — D509 Iron deficiency anemia, unspecified: Secondary | ICD-10-CM | POA: Diagnosis not present

## 2015-10-29 DIAGNOSIS — E7800 Pure hypercholesterolemia, unspecified: Secondary | ICD-10-CM | POA: Insufficient documentation

## 2015-10-29 DIAGNOSIS — R079 Chest pain, unspecified: Secondary | ICD-10-CM

## 2015-10-29 DIAGNOSIS — N184 Chronic kidney disease, stage 4 (severe): Secondary | ICD-10-CM | POA: Insufficient documentation

## 2015-10-29 DIAGNOSIS — R5383 Other fatigue: Secondary | ICD-10-CM | POA: Insufficient documentation

## 2015-10-29 DIAGNOSIS — T889XXA Complication of surgical and medical care, unspecified, initial encounter: Secondary | ICD-10-CM

## 2015-10-29 HISTORY — DX: Disorder of kidney and ureter, unspecified: N28.9

## 2015-10-29 HISTORY — DX: Type 2 diabetes mellitus without complications: E11.9

## 2015-10-29 HISTORY — DX: Essential (primary) hypertension: I10

## 2015-10-29 HISTORY — DX: Iron deficiency: E61.1

## 2015-10-29 LAB — BASIC METABOLIC PANEL
ANION GAP: 12 (ref 5–15)
BUN: 47 mg/dL — ABNORMAL HIGH (ref 6–20)
CO2: 22 mmol/L (ref 22–32)
Calcium: 9.3 mg/dL (ref 8.9–10.3)
Chloride: 104 mmol/L (ref 101–111)
Creatinine, Ser: 4.5 mg/dL — ABNORMAL HIGH (ref 0.61–1.24)
GFR calc Af Amer: 13 mL/min — ABNORMAL LOW (ref >60)
GFR, EST NON AFRICAN AMERICAN: 11 mL/min — AB (ref >60)
GLUCOSE: 151 mg/dL — AB (ref 65–99)
POTASSIUM: 4.8 mmol/L (ref 3.5–5.1)
Sodium: 138 mmol/L (ref 135–145)

## 2015-10-29 LAB — I-STAT CG4 LACTIC ACID, ED
LACTIC ACID, VENOUS: 1.11 mmol/L (ref 0.5–2.0)
LACTIC ACID, VENOUS: 0.93 mmol/L (ref 0.5–2.0)

## 2015-10-29 LAB — I-STAT TROPONIN, ED
Troponin i, poc: 0.02 ng/mL (ref 0.00–0.08)
Troponin i, poc: 0.01 ng/mL (ref 0.00–0.08)
Troponin i, poc: 0.02 ng/mL (ref 0.00–0.08)

## 2015-10-29 LAB — CBC
HEMATOCRIT: 33.7 % — AB (ref 39.0–52.0)
HEMOGLOBIN: 10.4 g/dL — AB (ref 13.0–17.0)
MCH: 24.6 pg — AB (ref 26.0–34.0)
MCHC: 30.9 g/dL (ref 30.0–36.0)
MCV: 79.9 fL (ref 78.0–100.0)
Platelets: 224 10*3/uL (ref 150–400)
RBC: 4.22 MIL/uL (ref 4.22–5.81)
RDW: 16 % — ABNORMAL HIGH (ref 11.5–15.5)
WBC: 6.8 10*3/uL (ref 4.0–10.5)

## 2015-10-29 LAB — BRAIN NATRIURETIC PEPTIDE: B Natriuretic Peptide: 74.5 pg/mL (ref 0.0–100.0)

## 2015-10-29 MED ORDER — AMLODIPINE BESYLATE 5 MG PO TABS
5.0000 mg | ORAL_TABLET | Freq: Once | ORAL | Status: AC
  Administered 2015-10-29: 5 mg via ORAL
  Filled 2015-10-29: qty 1

## 2015-10-29 MED ORDER — SODIUM CHLORIDE 0.9 % IV SOLN
510.0000 mg | INTRAVENOUS | Status: DC
  Administered 2015-10-29: 510 mg via INTRAVENOUS
  Filled 2015-10-29: qty 17

## 2015-10-29 MED ORDER — EPOETIN ALFA 20000 UNIT/ML IJ SOLN: 20000.0000 [IU] | INTRAMUSCULAR | Status: DC

## 2015-10-29 MED ORDER — ASPIRIN 81 MG PO CHEW
324.0000 mg | CHEWABLE_TABLET | Freq: Once | ORAL | Status: AC
  Administered 2015-10-29: 324 mg via ORAL
  Filled 2015-10-29: qty 4

## 2015-10-29 MED ORDER — METOPROLOL TARTRATE 25 MG PO TABS
25.0000 mg | ORAL_TABLET | Freq: Once | ORAL | Status: AC
  Administered 2015-10-29: 25 mg via ORAL
  Filled 2015-10-29: qty 1

## 2015-10-29 NOTE — Progress Notes (Addendum)
IV feraheme started and within 5 minutes patient complained of back pain.  Stopped infusion and hung NS.  Pt nauseous, diaphoretic and complaining of chest pain and pressure at 8/10, oxygen placed on patient. Dr Posey Pronto at [facility] notified and pt taken  to ER.

## 2015-10-29 NOTE — Discharge Instructions (Signed)
Nonspecific Chest Pain  °Chest pain can be caused by many different conditions. There is always a chance that your pain could be related to something serious, such as a heart attack or a blood clot in your lungs. Chest pain can also be caused by conditions that are not life-threatening. If you have chest pain, it is very important to follow up with your health care provider. °CAUSES  °Chest pain can be caused by: °· Heartburn. °· Pneumonia or bronchitis. °· Anxiety or stress. °· Inflammation around your heart (pericarditis) or lung (pleuritis or pleurisy). °· A blood clot in your lung. °· A collapsed lung (pneumothorax). It can develop suddenly on its own (spontaneous pneumothorax) or from trauma to the chest. °· Shingles infection (varicella-zoster virus). °· Heart attack. °· Damage to the bones, muscles, and cartilage that make up your chest wall. This can include: °¨ Bruised bones due to injury. °¨ Strained muscles or cartilage due to frequent or repeated coughing or overwork. °¨ Fracture to one or more ribs. °¨ Sore cartilage due to inflammation (costochondritis). °RISK FACTORS  °Risk factors for chest pain may include: °· Activities that increase your risk for trauma or injury to your chest. °· Respiratory infections or conditions that cause frequent coughing. °· Medical conditions or overeating that can cause heartburn. °· Heart disease or family history of heart disease. °· Conditions or health behaviors that increase your risk of developing a blood clot. °· Having had chicken pox (varicella zoster). °SIGNS AND SYMPTOMS °Chest pain can feel like: °· Burning or tingling on the surface of your chest or deep in your chest. °· Crushing, pressure, aching, or squeezing pain. °· Dull or sharp pain that is worse when you move, cough, or take a deep breath. °· Pain that is also felt in your back, neck, shoulder, or arm, or pain that spreads to any of these areas. °Your chest pain may come and go, or it may stay  constant. °DIAGNOSIS °Lab tests or other studies may be needed to find the cause of your pain. Your health care provider may have you take a test called an ambulatory ECG (electrocardiogram). An ECG records your heartbeat patterns at the time the test is performed. You may also have other tests, such as: °· Transthoracic echocardiogram (TTE). During echocardiography, sound waves are used to create a picture of all of the heart structures and to look at how blood flows through your heart. °· Transesophageal echocardiogram (TEE). This is a more advanced imaging test that obtains images from inside your body. It allows your health care provider to see your heart in finer detail. °· Cardiac monitoring. This allows your health care provider to monitor your heart rate and rhythm in real time. °· Holter monitor. This is a portable device that records your heartbeat and can help to diagnose abnormal heartbeats. It allows your health care provider to track your heart activity for several days, if needed. °· Stress tests. These can be done through exercise or by taking medicine that makes your heart beat more quickly. °· Blood tests. °· Imaging tests. °TREATMENT  °Your treatment depends on what is causing your chest pain. Treatment may include: °· Medicines. These may include: °¨ Acid blockers for heartburn. °¨ Anti-inflammatory medicine. °¨ Pain medicine for inflammatory conditions. °¨ Antibiotic medicine, if an infection is present. °¨ Medicines to dissolve blood clots. °¨ Medicines to treat coronary artery disease. °· Supportive care for conditions that do not require medicines. This may include: °¨ Resting. °¨ Applying heat   or cold packs to injured areas. °¨ Limiting activities until pain decreases. °HOME CARE INSTRUCTIONS °· If you were prescribed an antibiotic medicine, finish it all even if you start to feel better. °· Avoid any activities that bring on chest pain. °· Do not use any tobacco products, including  cigarettes, chewing tobacco, or electronic cigarettes. If you need help quitting, ask your health care provider. °· Do not drink alcohol. °· Take medicines only as directed by your health care provider. °· Keep all follow-up visits as directed by your health care provider. This is important. This includes any further testing if your chest pain does not go away. °· If heartburn is the cause for your chest pain, you may be told to keep your head raised (elevated) while sleeping. This reduces the chance that acid will go from your stomach into your esophagus. °· Make lifestyle changes as directed by your health care provider. These may include: °¨ Getting regular exercise. Ask your health care provider to suggest some activities that are safe for you. °¨ Eating a heart-healthy diet. A registered dietitian can help you to learn healthy eating options. °¨ Maintaining a healthy weight. °¨ Managing diabetes, if necessary. °¨ Reducing stress. °SEEK MEDICAL CARE IF: °· Your chest pain does not go away after treatment. °· You have a rash with blisters on your chest. °· You have a fever. °SEEK IMMEDIATE MEDICAL CARE IF:  °· Your chest pain is worse. °· You have an increasing cough, or you cough up blood. °· You have severe abdominal pain. °· You have severe weakness. °· You faint. °· You have chills. °· You have sudden, unexplained chest discomfort. °· You have sudden, unexplained discomfort in your arms, back, neck, or jaw. °· You have shortness of breath at any time. °· You suddenly start to sweat, or your skin gets clammy. °· You feel nauseous or you vomit. °· You suddenly feel light-headed or dizzy. °· Your heart begins to beat quickly, or it feels like it is skipping beats. °These symptoms may represent a serious problem that is an emergency. Do not wait to see if the symptoms will go away. Get medical help right away. Call your local emergency services (911 in the [place] Do not drive yourself to the hospital. °  °This  information is not intended to replace advice given to you by your health care provider. Make sure you discuss any questions you have with your health care provider. °  °Document Released: 09/23/2005 Document Revised: 01/04/2015 Document Reviewed: 07/20/2014 °Elsevier Interactive Patient Education ©2016 [employer] ° °

## 2015-10-29 NOTE — ED Notes (Signed)
MD at bedside. 

## 2015-10-29 NOTE — ED Notes (Signed)
Lab will add on BNP  °

## 2015-10-29 NOTE — ED Provider Notes (Signed)
CSN: [id number]     Arrival date & time 10/29/15  1416 History   First MD Initiated Contact with Patient 10/29/15 1459     Chief Complaint  Patient presents with  . Chest Pain     (Consider location/radiation/quality/duration/timing/severity/associated sxs/prior Treatment) HPI Comments:  79 year old male with a history of hypertension, diabetes, hypercholesterolemia, CKD with chronic anemia, presents from outpt infusion center for concern of development of SOB, nausea/vomiting and chest heaviness while he was receiving infusion of feraheme.  This is pt's first feraheme infusion. 15 minutes into infusion developed chest heaviness, sob, nausea one episode of emesis, and diaphoresis and chills.  Heaviness in middle of chest radiating towards back Severe pain  Lasted 10 minutes 1 episode of emesis Resolved after infusion stopped Did not receive nitro or epi No rash/throat or tongue swelling per pt On arrival reports chills, however on reeval reports asymptomatic  No hx of CAD/CHF or prior heart catheterization    Past Medical History  Diagnosis Date  . Diabetes mellitus without complication ([place])   . Hypertension   . Renal disorder     One Kidney  . Iron deficiency    Past Surgical History  Procedure Laterality Date  . Kidney surgery     History reviewed. No pertinent family history. Social History  Substance Use Topics  . Smoking status: Current Every Day Smoker    Types: Cigarettes  . Smokeless tobacco: None  . Alcohol Use: No    Review of Systems  Constitutional: Positive for fatigue. Negative for fever.  HENT: Negative for sore throat, trouble swallowing and voice change.   Eyes: Negative for visual disturbance.  Respiratory: Positive for shortness of breath (now resolved). Negative for cough and wheezing.   Cardiovascular: Positive for chest pain (heaviness now resolved).  Gastrointestinal: Positive for nausea (resolved) and vomiting (1 episode). Negative for  abdominal pain.  Genitourinary: Negative for difficulty urinating.  Musculoskeletal: Positive for back pain (now resolved). Negative for neck stiffness.  Skin: Negative for rash.  Neurological: Negative for syncope and headaches.      Allergies  Feraheme  Home Medications   Prior to Admission medications   Medication Sig Start Date End Date Taking? Authorizing Provider  amLODipine (NORVASC) 5 MG tablet Take 5 mg by mouth daily.   Yes Historical Provider, MD  cholecalciferol (VITAMIN D) 1000 UNITS tablet Take 1,000 Units by mouth daily.   Yes Historical Provider, MD  furosemide (LASIX) 40 MG tablet Take 40 mg by mouth daily.   Yes Historical Provider, MD  insulin aspart (NOVOLOG) 100 UNIT/ML injection Inject 15 Units into the skin 2 (two) times daily.   Yes Historical Provider, MD  Insulin Degludec (TRESIBA FLEXTOUCH) 100 UNIT/ML SOPN Inject 80 Units into the skin every evening.   Yes Historical Provider, MD  IRON PO Take 2 tablets by mouth daily.   Yes Historical Provider, MD  metoprolol tartrate (LOPRESSOR) 25 MG tablet Take 25 mg by mouth 2 (two) times daily.   Yes Historical Provider, MD   BP 176/75 mmHg  Pulse 74  Temp(Src) 98.5 F (36.9 C) (Oral)  Resp 16  Wt 256 lb (116.121 kg)  SpO2 96% Physical Exam  Constitutional: He is oriented to person, place, and time. He appears well-developed and well-nourished. No distress.  HENT:  Head: Normocephalic and atraumatic.  Mouth/Throat: Oropharynx is clear and moist.  Macroglossia chronic per pt  Eyes: Conjunctivae and EOM are normal. Pupils are equal, round, and reactive to light.  Neck: Normal range  of motion.  Cardiovascular: Normal rate, regular rhythm, normal heart sounds and intact distal pulses.  Exam reveals no gallop and no friction rub.   No murmur heard. Pulmonary/Chest: Effort normal and breath sounds normal. No respiratory distress. He has no wheezes. He has no rales.  Abdominal: Soft. He exhibits no distension.  There is no tenderness. There is no guarding.  Musculoskeletal: He exhibits edema (trace bilateral).  Neurological: He is alert and oriented to person, place, and time.  Skin: Skin is warm and dry. He is not diaphoretic.  Nursing note and vitals reviewed.   ED Course  Procedures (including critical care time) Labs Review Labs Reviewed  BASIC METABOLIC PANEL - Abnormal; Notable for the following:    Glucose, Bld 151 (*)    BUN 47 (*)    Creatinine, Ser 4.50 (*)    GFR calc non Af Amer 11 (*)    GFR calc Af Amer 13 (*)    All other components within normal limits  CBC - Abnormal; Notable for the following:    Hemoglobin 10.4 (*)    HCT 33.7 (*)    MCH 24.6 (*)    RDW 16.0 (*)    All other components within normal limits  BRAIN NATRIURETIC PEPTIDE  I-STAT TROPOININ, ED  I-STAT TROPOININ, ED  I-STAT CG4 LACTIC ACID, ED    Imaging Review Dg Chest 2 View  10/29/2015  CLINICAL DATA:  Shortness of breath. EXAM: CHEST  2 VIEW COMPARISON:  06/21/2010 FINDINGS: The cardiac silhouette, mediastinal and hilar contours are within normal limits. There is mild tortuosity of the thoracic aorta. Streaky bibasilar atelectasis but no infiltrates, edema or effusions. The bony thorax is grossly intact. IMPRESSION: No acute cardiopulmonary findings. Minimal streaky basilar atelectasis. Electronically Signed   By: Marijo Sanes M.D.   On: 10/29/2015 15:56   I have personally reviewed and evaluated these images and lab results as part of my medical decision-making.   EKG Interpretation   Date/Time:  Tuesday October 29 2015 14:19:40 EDT Ventricular Rate:  76 PR Interval:  174 QRS Duration: 150 QT Interval:  432 QTC Calculation: 486 R Axis:   38 Text Interpretation:  Normal sinus rhythm Right bundle branch block  Abnormal ECG Since last EKG in 2011, right bundle branch block is new  Confirmed by [facility] MD, [place] ([id number]) on 10/29/2015 4:48:06 PM      MDM   Final diagnoses:  Shortness of  breath, resolved  Side effects of treatment, initial encounter, possible reaction to Feraheme exposure  Chest pain, unspecified chest pain type, resolved   79 year old male with a history of hypertension, diabetes, hypercholesterolemia, CKD with chronic anemia, presents from outpt infusion center for concern of development of SOB, nausea/vomiting and chest heaviness while he was receiving infusion of feraheme.  Infusion was discontinued, and patient symptoms dissipated, with total symptoms lasting approximately 10 minutes. Differential diagnosis includes ACS, PE, pneumonia, acute pulmonary edema,  allergic reaction.  While symptoms may have represented an allergic reaction given nausea, vomiting and shortness of breath, patient does not have any persisting symptoms, did not receive any epinephrine, and have low suspicion for anaphylaxis or ongoing allergic reaction.  Chest x-ray shows no signs of pulmonary edema, no pneumonia. BNP is normal. No sign of worsening anemia. Lactic acid is within normal limits. No hypoxia and spontaneously resolving CP/SOB gives decreased suspicion for acute PE.  Labs appear at baseline.  Patient is a high risk HEAR score given historical factors as well as risk  factors.  EKG shows RBBB which is new for patient, however have no EKGs since 2011.  Discussed possibility of admission for observation with patient, however he declines and reports he has felt back to baseline since arriving to ED.  Given patient's symptoms developed during infusion, they may represent side effects of this, feel close outpatient follow-up for episode of chest pain and shortness of breath is not unreasonable.   Will evaluate a second troponin, and if this is positive will recommended admission, however if repeat troponin is also negative, patient may be discharged with close PCP follow-up. Reports he will be able to see his PCP in the next week.         Gareth Morgan, MD 10/29/15 [phone]

## 2015-10-29 NOTE — ED Notes (Signed)
Pt brought from medical infusion room, staff reports he was getting an Iron infusion and about 15 minutes into the medication he started having chest pain and SOB. Pt was placed on O2 and saline started prior to coming to the ED. Pt A&Ox4. Reports SOB at triage.

## 2016-02-21 ENCOUNTER — Other Ambulatory Visit: Payer: Self-pay | Admitting: *Deleted

## 2016-02-21 DIAGNOSIS — Z01810 Encounter for preprocedural cardiovascular examination: Secondary | ICD-10-CM

## 2016-02-21 DIAGNOSIS — N184 Chronic kidney disease, stage 4 (severe): Secondary | ICD-10-CM

## 2016-02-24 ENCOUNTER — Encounter: Payer: Self-pay | Admitting: Vascular Surgery

## 2016-03-03 ENCOUNTER — Inpatient Hospital Stay (HOSPITAL_COMMUNITY): Admission: RE | Admit: 2016-03-03 | Payer: Medicare HMO | Source: Ambulatory Visit

## 2016-03-18 ENCOUNTER — Encounter: Payer: Self-pay | Admitting: Vascular Surgery

## 2016-03-24 ENCOUNTER — Encounter: Payer: Self-pay | Admitting: Vascular Surgery

## 2016-03-24 ENCOUNTER — Ambulatory Visit (INDEPENDENT_AMBULATORY_CARE_PROVIDER_SITE_OTHER): Payer: Medicare HMO | Admitting: Vascular Surgery

## 2016-03-24 ENCOUNTER — Ambulatory Visit (HOSPITAL_COMMUNITY)
Admission: RE | Admit: 2016-03-24 | Discharge: 2016-03-24 | Disposition: A | Discharge status: Home / Self Care | Payer: Medicare HMO | Source: Ambulatory Visit | Attending: Vascular Surgery | Admitting: Vascular Surgery

## 2016-03-24 ENCOUNTER — Ambulatory Visit (INDEPENDENT_AMBULATORY_CARE_PROVIDER_SITE_OTHER)
Admission: RE | Admit: 2016-03-24 | Discharge: 2016-03-24 | Disposition: A | Discharge status: Home / Self Care | Payer: Medicare HMO | Source: Ambulatory Visit | Attending: Vascular Surgery | Admitting: Vascular Surgery

## 2016-03-24 ENCOUNTER — Other Ambulatory Visit: Payer: Self-pay

## 2016-03-24 VITALS — BP 153/75 | HR 69 | Temp 97.5°F | Resp 28 | Ht 69.0 in | Wt 259.5 lb

## 2016-03-24 DIAGNOSIS — Z01810 Encounter for preprocedural cardiovascular examination: Secondary | ICD-10-CM

## 2016-03-24 DIAGNOSIS — E1122 Type 2 diabetes mellitus with diabetic chronic kidney disease: Secondary | ICD-10-CM | POA: Insufficient documentation

## 2016-03-24 DIAGNOSIS — N184 Chronic kidney disease, stage 4 (severe): Secondary | ICD-10-CM

## 2016-03-24 DIAGNOSIS — I129 Hypertensive chronic kidney disease with stage 1 through stage 4 chronic kidney disease, or unspecified chronic kidney disease: Secondary | ICD-10-CM | POA: Insufficient documentation

## 2016-03-24 NOTE — Progress Notes (Signed)
Filed Vitals:   03/24/16 1312 03/24/16 1315  BP: 153/73 153/75  Pulse: 69   Temp: 97.5 F (36.4 C)   TempSrc: Oral   Resp: 28   Height: 5\' 9"  (1.753 m)   Weight: 259 lb 8 oz (117.708 kg)

## 2016-03-24 NOTE — Progress Notes (Signed)
[facility]  Patient name: Steven Trevino MRN: [id number] DOB: 1936/10/09 Sex: male  REASON FOR CONSULT: Establish access for hemodialysis  HPI: Steven Trevino is a 80 y.o. male, who is seen today to discuss access for hemodialysis. He has stage IV renal insufficiency is not yet on hemodialysis. He has one kidney and has hypertension and diabetes as well as etiology for this. He is here today with his wife who seems to have a better understanding of his disease process and the patient himself. Denies any history of heart disease and does not have any history of peripheral vascular occlusive disease. Does have a history of smoking 2 packs cigarettes per day  Past Medical History  Diagnosis Date  . Diabetes mellitus without complication ([place])   . Hypertension   . Renal disorder     One Kidney  . Iron deficiency     History reviewed. No pertinent family history.  SOCIAL HISTORY: Social History   Social History  . Marital Status: Married    Spouse Name: N/A  . Number of Children: N/A  . Years of Education: N/A   Occupational History  . Not on file.   Social History Main Topics  . Smoking status: Current Every Day Smoker -- 2.00 packs/day for 60 years    Types: Cigarettes  . Smokeless tobacco: Not on file  . Alcohol Use: No  . Drug Use: No  . Sexual Activity: Not on file   Other Topics Concern  . Not on file   Social History Narrative    Allergies  Allergen Reactions  . Feraheme [Ferumoxytol] Other (See Comments)    Chest pain, back pain, diaphoresis, nausea/vomitting    Current Outpatient Prescriptions  Medication Sig Dispense Refill  . amLODipine (NORVASC) 5 MG tablet Take 5 mg by mouth daily.    . cholecalciferol (VITAMIN D) 1000 UNITS tablet Take 1,000 Units by mouth daily.    . furosemide (LASIX) 40 MG tablet Take 40 mg by mouth 2 (two) times daily.     . insulin aspart (NOVOLOG) 100 UNIT/ML injection Inject 15 Units into the  skin 2 (two) times daily.    . Insulin Degludec (TRESIBA FLEXTOUCH) 100 UNIT/ML SOPN Inject 80 Units into the skin every evening.    . IRON PO Take 2 tablets by mouth daily.    . metoprolol tartrate (LOPRESSOR) 25 MG tablet Take 25 mg by mouth 2 (two) times daily.    . promethazine (PHENERGAN) 12.5 MG tablet Take 12.5 mg by mouth 3 (three) times daily as needed for nausea or vomiting.     No current facility-administered medications for this visit.    REVIEW OF SYSTEMS:  [X]  denotes positive finding, [ ]  denotes negative finding Cardiac  Comments:  Chest pain or chest pressure:    Shortness of breath upon exertion: x   Short of breath when lying flat:    Irregular heart rhythm:        Vascular    Pain in calf, thigh, or hip brought on by ambulation:    Pain in feet at night that wakes you up from your sleep:     Blood clot in your veins:    Leg swelling:         Pulmonary    Oxygen at home:    Productive cough:     Wheezing:         Neurologic    Sudden weakness in arms or legs:  Sudden numbness in arms or legs:     Sudden onset of difficulty speaking or slurred speech:    Temporary loss of vision in one eye:     Problems with dizziness:         Gastrointestinal    Blood in stool:     Vomited blood:         Genitourinary    Burning when urinating:     Blood in urine:        Psychiatric    Major depression:         Hematologic    Bleeding problems:    Problems with blood clotting too easily:        Skin    Rashes or ulcers:        Constitutional    Fever or chills:      PHYSICAL EXAM: Filed Vitals:   03/24/16 1312 03/24/16 1315  BP: 153/73 153/75  Pulse: 69   Temp: 97.5 F (36.4 C)   TempSrc: Oral   Resp: 28   Height: 5\' 9"  (1.753 m)   Weight: 259 lb 8 oz (117.708 kg)     GENERAL: The patient is a well-nourished male, in no acute distress. The vital signs are documented above. CARDIAC: There is a regular rate and rhythm.  VASCULAR: 2+ radial  pulses bilaterally PULMONARY: There is good air exchange bilaterally without wheezing or rales. ABDOMEN: Soft and non-tender with normal pitched bowel sounds. Obese  MUSCULOSKELETAL: There are no major deformities or cyanosis. NEUROLOGIC: No focal weakness or paresthesias are detected. SKIN: Very irregular rough raised plaques on both lower extremities mostly from his forearms down onto his hands. PSYCHIATRIC: The patient has a normal affect.  DATA:  Arterial and venous upper extremity duplex were obtained. This reveals normal arterial flow to his hands bilaterally. He has extremely small cephalic veins in both arms. Basilic veins are good caliber bilaterally.  MEDICAL ISSUES: Right handed patient with need for hemodialysis access for stage IV kidney disease. Had long discussion with the patient and his wife present regarding catheter, AV graft and AV fistula creation. I would recommend left upper arm basilic vein fistula. Explained that this would require transposition. They understand and wish to proceed when he can be placed on our operative schedule. Understands possibility of non-maturation of the fistula.   Curt Jews [facility] [place]: [phone]

## 2016-03-31 ENCOUNTER — Ambulatory Visit (HOSPITAL_COMMUNITY): Payer: Medicare HMO | Admitting: Anesthesiology

## 2016-03-31 ENCOUNTER — Encounter (HOSPITAL_COMMUNITY): Payer: Self-pay | Admitting: *Deleted

## 2016-03-31 MED ORDER — SODIUM CHLORIDE 0.9 % IV SOLN: INTRAVENOUS | Status: DC

## 2016-03-31 MED ORDER — DEXTROSE 5 % IV SOLN
1.5000 g | INTRAVENOUS | Status: DC
  Filled 2016-03-31: qty 1.5

## 2016-03-31 NOTE — Progress Notes (Signed)
Spoke with pt's wife for pre-op call. She states pt does not have a cardiac history and denies any recent complaints of chest pain. States he does have sob at times with exertion. Pt has COPD per wife. Pt is diabetic, Mrs. Rivenbark states fasting blood sugar has been running between 175-200 recently. They were instructed by Dr. Luther Parody office to have pt take 1/2 dose of Tresiba and Novolog insulin tonight and no insulin in the AM. I instructed her to check pt's blood sugar in the AM and if it is 70 or below to treat with a 1/2 cup (4 oz) of clear juice (apple or cranberry) and then recheck blood sugar 15 minutes after drinking juice. If still 70 or below instructed her to call Short Stay and speak with a nurse. She voiced understanding. These instructions given per Diabetes Medication Adjustment Guidelines Prior to Procedure and Surgery.

## 2016-03-31 NOTE — Anesthesia Preprocedure Evaluation (Deleted)
Anesthesia Evaluation  Patient identified by MRN, date of birth, ID band Patient awake    Reviewed: Allergy & Precautions, NPO status , Patient's Chart, lab work & pertinent test results, reviewed documented beta blocker date and time   Airway Mallampati: II  TM Distance: >3 FB Neck ROM: Full    Dental no notable dental hx.    Pulmonary shortness of breath, sleep apnea , pneumonia, COPD, Current Smoker,    Pulmonary exam normal breath sounds clear to auscultation       Cardiovascular hypertension, Pt. on medications and Pt. on home beta blockers Normal cardiovascular exam Rhythm:Regular Rate:Normal     Neuro/Psych negative neurological ROS  negative psych ROS   GI/Hepatic Neg liver ROS, hiatal hernia,   Endo/Other  diabetes, Type 2, Oral Hypoglycemic Agents  Renal/GU ESRF and CRFRenal disease     Musculoskeletal negative musculoskeletal ROS (+)   Abdominal (+) + obese,   Peds  Hematology  (+) Blood dyscrasia, anemia ,   Anesthesia Other Findings   Reproductive/Obstetrics negative OB ROS                             Anesthesia Physical Anesthesia Plan  ASA: III  Anesthesia Plan: General   Post-op Pain Management:    Induction: Intravenous  Airway Management Planned: LMA  Additional Equipment:   Intra-op Plan:   Post-operative Plan: Extubation in OR  Informed Consent: I have reviewed the patients History and Physical, chart, labs and discussed the procedure including the risks, benefits and alternatives for the proposed anesthesia with the patient or authorized representative who has indicated his/her understanding and acceptance.   Dental advisory given  Plan Discussed with: CRNA  Anesthesia Plan Comments:         Anesthesia Quick Evaluation

## 2016-04-01 ENCOUNTER — Encounter (HOSPITAL_COMMUNITY): Payer: Self-pay | Admitting: Anesthesiology

## 2016-04-01 ENCOUNTER — Ambulatory Visit (HOSPITAL_COMMUNITY)
Admission: RE | Admit: 2016-04-01 | Discharge: 2016-04-01 | Disposition: A | Discharge status: Home / Self Care | Payer: Medicare HMO | Source: Ambulatory Visit | Attending: Vascular Surgery | Admitting: Vascular Surgery

## 2016-04-01 ENCOUNTER — Other Ambulatory Visit: Payer: Self-pay

## 2016-04-01 ENCOUNTER — Encounter (HOSPITAL_COMMUNITY)
Admission: RE | Disposition: A | Discharge status: Home / Self Care | Payer: Self-pay | Source: Ambulatory Visit | Attending: Vascular Surgery

## 2016-04-01 DIAGNOSIS — J449 Chronic obstructive pulmonary disease, unspecified: Secondary | ICD-10-CM | POA: Insufficient documentation

## 2016-04-01 DIAGNOSIS — N186 End stage renal disease: Secondary | ICD-10-CM | POA: Diagnosis present

## 2016-04-01 DIAGNOSIS — F17200 Nicotine dependence, unspecified, uncomplicated: Secondary | ICD-10-CM | POA: Insufficient documentation

## 2016-04-01 DIAGNOSIS — I120 Hypertensive chronic kidney disease with stage 5 chronic kidney disease or end stage renal disease: Secondary | ICD-10-CM | POA: Insufficient documentation

## 2016-04-01 DIAGNOSIS — Z5309 Procedure and treatment not carried out because of other contraindication: Secondary | ICD-10-CM | POA: Insufficient documentation

## 2016-04-01 DIAGNOSIS — E1122 Type 2 diabetes mellitus with diabetic chronic kidney disease: Secondary | ICD-10-CM | POA: Diagnosis not present

## 2016-04-01 HISTORY — DX: Chronic obstructive pulmonary disease, unspecified: J44.9

## 2016-04-01 HISTORY — DX: Pneumonia, unspecified organism: J18.9

## 2016-04-01 HISTORY — DX: Chronic kidney disease, unspecified: N18.9

## 2016-04-01 HISTORY — DX: Malignant (primary) neoplasm, unspecified: C80.1

## 2016-04-01 HISTORY — DX: Anemia, unspecified: D64.9

## 2016-04-01 HISTORY — DX: Sleep apnea, unspecified: G47.30

## 2016-04-01 HISTORY — DX: Reserved for inherently not codable concepts without codable children: IMO0001

## 2016-04-01 HISTORY — DX: Personal history of other diseases of the digestive system: Z87.19

## 2016-04-01 SURGERY — TRANSPOSITION, VEIN, BASILIC
Anesthesia: General | Laterality: Left

## 2016-04-01 MED ORDER — FENTANYL CITRATE (PF) 250 MCG/5ML IJ SOLN
INTRAMUSCULAR | Status: AC
  Filled 2016-04-01: qty 5

## 2016-04-01 MED ORDER — CHLORHEXIDINE GLUCONATE CLOTH 2 % EX PADS: 6.0000 | MEDICATED_PAD | Freq: Once | CUTANEOUS | Status: DC

## 2016-04-01 MED ORDER — PROPOFOL 10 MG/ML IV BOLUS
INTRAVENOUS | Status: AC
  Filled 2016-04-01: qty 40

## 2016-04-01 SURGICAL SUPPLY — 28 items
BENZOIN TINCTURE PRP APPL 2/3 (GAUZE/BANDAGES/DRESSINGS) ×3 IMPLANT
CANISTER SUCTION 2500CC (MISCELLANEOUS) ×3 IMPLANT
CANNULA VESSEL 3MM 2 BLNT TIP (CANNULA) IMPLANT
CLIP LIGATING EXTRA MED SLVR (CLIP) ×3 IMPLANT
CLIP LIGATING EXTRA SM BLUE (MISCELLANEOUS) ×3 IMPLANT
CLOSURE WOUND 1/2 X4 (GAUZE/BANDAGES/DRESSINGS) ×1
COVER PROBE W GEL 5X96 (DRAPES) ×3 IMPLANT
DECANTER SPIKE VIAL GLASS SM (MISCELLANEOUS) ×3 IMPLANT
ELECT REM PT RETURN 9FT ADLT (ELECTROSURGICAL) ×3
ELECTRODE REM PT RTRN 9FT ADLT (ELECTROSURGICAL) ×1 IMPLANT
GAUZE SPONGE 4X4 12PLY STRL (GAUZE/BANDAGES/DRESSINGS) ×3 IMPLANT
GEL ULTRASOUND 20GR AQUASONIC (MISCELLANEOUS) IMPLANT
GLOVE SS BIOGEL STRL SZ 7.5 (GLOVE) ×1 IMPLANT
GLOVE SUPERSENSE BIOGEL SZ 7.5 (GLOVE) ×2
GOWN STRL REUS W/ TWL LRG LVL3 (GOWN DISPOSABLE) ×3 IMPLANT
GOWN STRL REUS W/TWL LRG LVL3 (GOWN DISPOSABLE) ×6
KIT BASIN OR (CUSTOM PROCEDURE TRAY) ×3 IMPLANT
KIT ROOM TURNOVER OR (KITS) ×3 IMPLANT
NS IRRIG 1000ML POUR BTL (IV SOLUTION) ×3 IMPLANT
PACK CV ACCESS (CUSTOM PROCEDURE TRAY) ×3 IMPLANT
PAD ARMBOARD 7.5X6 YLW CONV (MISCELLANEOUS) ×6 IMPLANT
STRIP CLOSURE SKIN 1/2X4 (GAUZE/BANDAGES/DRESSINGS) ×2 IMPLANT
SUT PROLENE 6 0 CC (SUTURE) ×3 IMPLANT
SUT SILK 2 0 SH (SUTURE) ×3 IMPLANT
SUT VIC AB 3-0 SH 27 (SUTURE) ×2
SUT VIC AB 3-0 SH 27X BRD (SUTURE) ×1 IMPLANT
UNDERPAD 30X30 INCONTINENT (UNDERPADS AND DIAPERS) ×3 IMPLANT
WATER STERILE IRR 1000ML POUR (IV SOLUTION) ×3 IMPLANT

## 2016-04-01 NOTE — Progress Notes (Signed)
Surgery canceled. Patient discharged to home. All patient belongings taken with patient.

## 2016-04-14 ENCOUNTER — Encounter (HOSPITAL_COMMUNITY): Payer: Self-pay | Admitting: *Deleted

## 2016-04-14 MED ORDER — SODIUM CHLORIDE 0.9 % IV SOLN: INTRAVENOUS | Status: DC

## 2016-04-14 MED ORDER — DEXTROSE 5 % IV SOLN: 1.5000 g | INTRAVENOUS | Status: AC

## 2016-04-14 NOTE — Progress Notes (Signed)
Spoke with Mrs. Metter again for pre-op call. She verifies that nothing has changed with pt's allergies, medications, medical and surgical history. Gave her pre-op orders. She voiced understanding.

## 2016-04-15 ENCOUNTER — Ambulatory Visit (HOSPITAL_COMMUNITY): Admission: RE | Admit: 2016-04-15 | Payer: Medicare HMO | Source: Ambulatory Visit | Admitting: Vascular Surgery

## 2016-04-15 ENCOUNTER — Encounter (HOSPITAL_COMMUNITY): Payer: Self-pay | Admitting: Anesthesiology

## 2016-04-15 SURGERY — TRANSPOSITION, VEIN, BASILIC
Anesthesia: Choice | Laterality: Left

## 2016-04-17 ENCOUNTER — Other Ambulatory Visit: Payer: Self-pay

## 2016-04-21 NOTE — Progress Notes (Signed)
I was unable to reach patient by phone.  I left  A message on voice mail.  I instructed the patient to arrive at [facility] entrance at 8:00A M  , nothing to eat or drink after midnight.   I instructed the patient to take the following medications in the am with just enough water to get them down: Amlodipine, Metoprolol. I asked patient to not wear any lotions, powders, cologne, jewelry, piercing, make-up or nail polish.  I asked the patient to call [phone], in the am if there were any questions or problems. Insulin Instructions as instructed by Dr Donnetta Hutching 1/2 dose Tyler Aas and 1/2 dose of  Novolog.  Noo Insulin in am.  I instructed patient to check CBG to check CBG and if it is less than 70 to treat it with Glucose Gel, Glucose tablets or 1/2 cup of clear juice like apple juice or cranberry juice, or 1/2 cup of regular soda. (not cream soda). I instructed patient to recheck CBG in 15 minutes and if CBG is not greater than 70, to  Call [phone] (pre- op). If it is before pre-op opens to retreat as before and recheck CBG in 15 minutes. I told patient to make note of time that liquid is taken and amount, that surgical time may have to be adjusted.

## 2016-04-22 ENCOUNTER — Encounter (HOSPITAL_COMMUNITY): Payer: Self-pay | Admitting: Anesthesiology

## 2016-04-22 ENCOUNTER — Encounter (HOSPITAL_COMMUNITY): Admission: RE | Payer: Self-pay | Source: Ambulatory Visit

## 2016-04-22 ENCOUNTER — Ambulatory Visit (HOSPITAL_COMMUNITY): Admission: RE | Admit: 2016-04-22 | Payer: Medicare HMO | Source: Ambulatory Visit | Admitting: Vascular Surgery

## 2016-04-22 SURGERY — TRANSPOSITION, VEIN, BASILIC
Anesthesia: Choice | Site: Arm Upper | Laterality: Left

## 2016-04-22 MED ORDER — EPHEDRINE SULFATE 50 MG/ML IJ SOLN
INTRAMUSCULAR | Status: AC
  Filled 2016-04-22: qty 1

## 2016-04-22 MED ORDER — FENTANYL CITRATE (PF) 250 MCG/5ML IJ SOLN
INTRAMUSCULAR | Status: AC
  Filled 2016-04-22: qty 5

## 2016-04-22 MED ORDER — LIDOCAINE HCL (CARDIAC) 20 MG/ML IV SOLN
INTRAVENOUS | Status: AC
  Filled 2016-04-22: qty 5

## 2016-04-22 MED ORDER — SODIUM CHLORIDE 0.9 % IJ SOLN
INTRAMUSCULAR | Status: AC
  Filled 2016-04-22: qty 10

## 2016-04-22 MED ORDER — PROPOFOL 10 MG/ML IV BOLUS
INTRAVENOUS | Status: AC
  Filled 2016-04-22: qty 40

## 2016-04-22 MED ORDER — MIDAZOLAM HCL 2 MG/2ML IJ SOLN
INTRAMUSCULAR | Status: AC
  Filled 2016-04-22: qty 2

## 2016-04-22 MED ORDER — ONDANSETRON HCL 4 MG/2ML IJ SOLN
INTRAMUSCULAR | Status: AC
  Filled 2016-04-22: qty 2

## 2017-02-07 ENCOUNTER — Inpatient Hospital Stay (HOSPITAL_COMMUNITY): Payer: Medicare HMO

## 2017-02-07 ENCOUNTER — Encounter (HOSPITAL_COMMUNITY): Payer: Self-pay

## 2017-02-07 ENCOUNTER — Emergency Department (HOSPITAL_COMMUNITY): Payer: Medicare HMO

## 2017-02-07 ENCOUNTER — Inpatient Hospital Stay (HOSPITAL_COMMUNITY)
Admission: EM | Admit: 2017-02-07 | Discharge: 2017-02-11 | DRG: 194 | Disposition: A | Discharge status: Home / Self Care | Payer: Medicare HMO | Attending: Internal Medicine | Admitting: Internal Medicine

## 2017-02-07 DIAGNOSIS — Z833 Family history of diabetes mellitus: Secondary | ICD-10-CM

## 2017-02-07 DIAGNOSIS — Z23 Encounter for immunization: Secondary | ICD-10-CM | POA: Diagnosis present

## 2017-02-07 DIAGNOSIS — E1136 Type 2 diabetes mellitus with diabetic cataract: Secondary | ICD-10-CM | POA: Diagnosis present

## 2017-02-07 DIAGNOSIS — Z9889 Other specified postprocedural states: Secondary | ICD-10-CM | POA: Diagnosis not present

## 2017-02-07 DIAGNOSIS — Z79899 Other long term (current) drug therapy: Secondary | ICD-10-CM

## 2017-02-07 DIAGNOSIS — D329 Benign neoplasm of meninges, unspecified: Secondary | ICD-10-CM

## 2017-02-07 DIAGNOSIS — D649 Anemia, unspecified: Secondary | ICD-10-CM | POA: Diagnosis present

## 2017-02-07 DIAGNOSIS — Z7189 Other specified counseling: Secondary | ICD-10-CM | POA: Diagnosis not present

## 2017-02-07 DIAGNOSIS — K5900 Constipation, unspecified: Secondary | ICD-10-CM | POA: Diagnosis present

## 2017-02-07 DIAGNOSIS — E119 Type 2 diabetes mellitus without complications: Secondary | ICD-10-CM

## 2017-02-07 DIAGNOSIS — Z905 Acquired absence of kidney: Secondary | ICD-10-CM | POA: Diagnosis not present

## 2017-02-07 DIAGNOSIS — K5909 Other constipation: Secondary | ICD-10-CM

## 2017-02-07 DIAGNOSIS — R1310 Dysphagia, unspecified: Secondary | ICD-10-CM | POA: Diagnosis present

## 2017-02-07 DIAGNOSIS — J440 Chronic obstructive pulmonary disease with acute lower respiratory infection: Secondary | ICD-10-CM | POA: Diagnosis present

## 2017-02-07 DIAGNOSIS — G4730 Sleep apnea, unspecified: Secondary | ICD-10-CM | POA: Diagnosis present

## 2017-02-07 DIAGNOSIS — Z66 Do not resuscitate: Secondary | ICD-10-CM | POA: Diagnosis present

## 2017-02-07 DIAGNOSIS — N185 Chronic kidney disease, stage 5: Secondary | ICD-10-CM

## 2017-02-07 DIAGNOSIS — R911 Solitary pulmonary nodule: Secondary | ICD-10-CM | POA: Diagnosis present

## 2017-02-07 DIAGNOSIS — F17210 Nicotine dependence, cigarettes, uncomplicated: Secondary | ICD-10-CM | POA: Diagnosis present

## 2017-02-07 DIAGNOSIS — Z888 Allergy status to other drugs, medicaments and biological substances status: Secondary | ICD-10-CM

## 2017-02-07 DIAGNOSIS — J181 Lobar pneumonia, unspecified organism: Secondary | ICD-10-CM | POA: Diagnosis not present

## 2017-02-07 DIAGNOSIS — N189 Chronic kidney disease, unspecified: Secondary | ICD-10-CM | POA: Diagnosis present

## 2017-02-07 DIAGNOSIS — R0602 Shortness of breath: Secondary | ICD-10-CM

## 2017-02-07 DIAGNOSIS — Z515 Encounter for palliative care: Secondary | ICD-10-CM

## 2017-02-07 DIAGNOSIS — R531 Weakness: Secondary | ICD-10-CM | POA: Insufficient documentation

## 2017-02-07 DIAGNOSIS — Z794 Long term (current) use of insulin: Secondary | ICD-10-CM | POA: Diagnosis not present

## 2017-02-07 DIAGNOSIS — R451 Restlessness and agitation: Secondary | ICD-10-CM | POA: Diagnosis not present

## 2017-02-07 DIAGNOSIS — Z716 Tobacco abuse counseling: Secondary | ICD-10-CM

## 2017-02-07 DIAGNOSIS — I120 Hypertensive chronic kidney disease with stage 5 chronic kidney disease or end stage renal disease: Secondary | ICD-10-CM | POA: Diagnosis present

## 2017-02-07 DIAGNOSIS — J90 Pleural effusion, not elsewhere classified: Secondary | ICD-10-CM | POA: Diagnosis present

## 2017-02-07 DIAGNOSIS — E1122 Type 2 diabetes mellitus with diabetic chronic kidney disease: Secondary | ICD-10-CM | POA: Diagnosis present

## 2017-02-07 DIAGNOSIS — J189 Pneumonia, unspecified organism: Secondary | ICD-10-CM | POA: Diagnosis not present

## 2017-02-07 DIAGNOSIS — Z85828 Personal history of other malignant neoplasm of skin: Secondary | ICD-10-CM

## 2017-02-07 DIAGNOSIS — C9200 Acute myeloblastic leukemia, not having achieved remission: Secondary | ICD-10-CM | POA: Diagnosis present

## 2017-02-07 DIAGNOSIS — I10 Essential (primary) hypertension: Secondary | ICD-10-CM

## 2017-02-07 DIAGNOSIS — E611 Iron deficiency: Secondary | ICD-10-CM | POA: Diagnosis present

## 2017-02-07 DIAGNOSIS — F0390 Unspecified dementia without behavioral disturbance: Secondary | ICD-10-CM | POA: Diagnosis present

## 2017-02-07 DIAGNOSIS — N184 Chronic kidney disease, stage 4 (severe): Secondary | ICD-10-CM

## 2017-02-07 DIAGNOSIS — R0600 Dyspnea, unspecified: Secondary | ICD-10-CM | POA: Diagnosis not present

## 2017-02-07 LAB — URINALYSIS, ROUTINE W REFLEX MICROSCOPIC
Bilirubin Urine: NEGATIVE
Glucose, UA: 150 mg/dL — AB
Hgb urine dipstick: NEGATIVE
Ketones, ur: NEGATIVE mg/dL
Leukocytes, UA: NEGATIVE
Nitrite: NEGATIVE
Protein, ur: >=300 mg/dL — AB
Specific Gravity, Urine: 1.014 (ref 1.005–1.030)
pH: 5 (ref 5.0–8.0)

## 2017-02-07 LAB — CBC
HCT: 34.5 % — ABNORMAL LOW (ref 39.0–52.0)
Hemoglobin: 10.9 g/dL — ABNORMAL LOW (ref 13.0–17.0)
MCH: 24.9 pg — ABNORMAL LOW (ref 26.0–34.0)
MCHC: 31.6 g/dL (ref 30.0–36.0)
MCV: 78.9 fL (ref 78.0–100.0)
Platelets: 314 10*3/uL (ref 150–400)
RBC: 4.37 MIL/uL (ref 4.22–5.81)
RDW: 15 % (ref 11.5–15.5)
WBC: 10.5 10*3/uL (ref 4.0–10.5)

## 2017-02-07 LAB — BASIC METABOLIC PANEL
Anion gap: 9 (ref 5–15)
BUN: 37 mg/dL — ABNORMAL HIGH (ref 6–20)
CO2: 25 mmol/L (ref 22–32)
Calcium: 11.8 mg/dL — ABNORMAL HIGH (ref 8.9–10.3)
Chloride: 103 mmol/L (ref 101–111)
Creatinine, Ser: 5.15 mg/dL — ABNORMAL HIGH (ref 0.61–1.24)
GFR calc Af Amer: 11 mL/min — ABNORMAL LOW (ref >60)
GFR calc non Af Amer: 10 mL/min — ABNORMAL LOW (ref >60)
Glucose, Bld: 126 mg/dL — ABNORMAL HIGH (ref 65–99)
Potassium: 4 mmol/L (ref 3.5–5.1)
Sodium: 137 mmol/L (ref 135–145)

## 2017-02-07 LAB — GLUCOSE, CAPILLARY: GLUCOSE-CAPILLARY: 132 mg/dL — AB (ref 65–99)

## 2017-02-07 LAB — MAGNESIUM: Magnesium: 2.2 mg/dL (ref 1.7–2.4)

## 2017-02-07 LAB — CBG MONITORING, ED: Glucose-Capillary: 126 mg/dL — ABNORMAL HIGH (ref 65–99)

## 2017-02-07 MED ORDER — SODIUM CHLORIDE 0.9 % IV SOLN
INTRAVENOUS | Status: AC
  Administered 2017-02-07: 23:00:00 via INTRAVENOUS

## 2017-02-07 MED ORDER — ACETAMINOPHEN 650 MG RE SUPP: 650.0000 mg | Freq: Four times a day (QID) | RECTAL | Status: DC | PRN

## 2017-02-07 MED ORDER — IPRATROPIUM-ALBUTEROL 0.5-2.5 (3) MG/3ML IN SOLN
3.0000 mL | Freq: Four times a day (QID) | RESPIRATORY_TRACT | Status: DC
  Administered 2017-02-07: 3 mL via RESPIRATORY_TRACT
  Filled 2017-02-07: qty 3

## 2017-02-07 MED ORDER — ONDANSETRON HCL 4 MG PO TABS
4.0000 mg | ORAL_TABLET | Freq: Four times a day (QID) | ORAL | Status: DC | PRN
Start: 2017-02-07 — End: 2017-02-11

## 2017-02-07 MED ORDER — DEXTROSE 5 % IV SOLN
1.0000 g | Freq: Once | INTRAVENOUS | Status: AC
  Administered 2017-02-07: 1 g via INTRAVENOUS
  Filled 2017-02-07: qty 10

## 2017-02-07 MED ORDER — HEPARIN SODIUM (PORCINE) 5000 UNIT/ML IJ SOLN
5000.0000 [IU] | Freq: Three times a day (TID) | INTRAMUSCULAR | Status: DC
  Administered 2017-02-07 – 2017-02-10 (×9): 5000 [IU] via SUBCUTANEOUS
  Filled 2017-02-07 (×9): qty 1

## 2017-02-07 MED ORDER — ONDANSETRON HCL 4 MG/2ML IJ SOLN
4.0000 mg | Freq: Four times a day (QID) | INTRAMUSCULAR | Status: DC | PRN
  Administered 2017-02-07 – 2017-02-09 (×4): 4 mg via INTRAVENOUS
  Filled 2017-02-07 (×4): qty 2

## 2017-02-07 MED ORDER — ACETAMINOPHEN 325 MG PO TABS
650.0000 mg | ORAL_TABLET | Freq: Four times a day (QID) | ORAL | Status: DC | PRN
  Administered 2017-02-10: 650 mg via ORAL
  Filled 2017-02-07: qty 2

## 2017-02-07 MED ORDER — BISACODYL 5 MG PO TBEC
5.0000 mg | DELAYED_RELEASE_TABLET | Freq: Every day | ORAL | Status: DC | PRN
  Administered 2017-02-08: 5 mg via ORAL
  Filled 2017-02-07: qty 1

## 2017-02-07 MED ORDER — NICOTINE 14 MG/24HR TD PT24: 14.0000 mg | MEDICATED_PATCH | Freq: Every day | TRANSDERMAL | Status: DC

## 2017-02-07 MED ORDER — AZITHROMYCIN 250 MG PO TABS
500.0000 mg | ORAL_TABLET | Freq: Every day | ORAL | Status: DC
  Administered 2017-02-07 – 2017-02-10 (×4): 500 mg via ORAL
  Filled 2017-02-07 (×4): qty 2

## 2017-02-07 MED ORDER — DOCUSATE SODIUM 100 MG PO CAPS
100.0000 mg | ORAL_CAPSULE | Freq: Two times a day (BID) | ORAL | Status: DC
  Administered 2017-02-08 – 2017-02-10 (×6): 100 mg via ORAL
  Filled 2017-02-07 (×7): qty 1

## 2017-02-07 MED ORDER — DEXTROSE 5 % IV SOLN
1.0000 g | INTRAVENOUS | Status: DC
  Administered 2017-02-08 – 2017-02-10 (×3): 1 g via INTRAVENOUS
  Filled 2017-02-07 (×4): qty 10

## 2017-02-07 MED ORDER — IPRATROPIUM-ALBUTEROL 0.5-2.5 (3) MG/3ML IN SOLN
3.0000 mL | Freq: Three times a day (TID) | RESPIRATORY_TRACT | Status: DC
  Administered 2017-02-08 – 2017-02-11 (×7): 3 mL via RESPIRATORY_TRACT
  Filled 2017-02-07 (×10): qty 3

## 2017-02-07 MED ORDER — INSULIN ASPART 100 UNIT/ML ~~LOC~~ SOLN
0.0000 [IU] | Freq: Three times a day (TID) | SUBCUTANEOUS | Status: DC
  Administered 2017-02-08 – 2017-02-10 (×3): 1 [IU] via SUBCUTANEOUS

## 2017-02-07 MED ORDER — INSULIN ASPART 100 UNIT/ML ~~LOC~~ SOLN: 0.0000 [IU] | Freq: Every day | SUBCUTANEOUS | Status: DC

## 2017-02-07 NOTE — ED Notes (Signed)
Pt cannot use restroom at this time, aware urine specimen is needed.  

## 2017-02-07 NOTE — H&P (Signed)
History and Physical    Steven Trevino [id number] DOB: 08-31-1936 DOA: 02/07/2017  PCP: Imagene Riches, NP, has not seen a PCP for 8 months Patient coming from: home  Chief Complaint: weakness  HPI: Steven Trevino is a 81 y.o. male with medical history significant of HTN, low iron, DM2, CKD stage V, COPD, some underlying dementia who presents for 2 weeks of weakness and malaise.  He and his wife note that about 2 weeks ago he became so weak that he just didn't want to get out of bed.  Since that time he has been mainly in bed and only getting up to go to the bathroom.  He has had less PO intake, decreased UOP, cough productive of a white sputum for the last 2-3 days, SOB with walking, dizziness and lightheadedness.  He has not had a BM in 10 days either, but has no abdominal pain, nausea, vomiting or diarrhea.  He denies sick contacts, headache, vision change, focal weakness, travel, chest pain, problems swallowing.  He has not been sick like this before.  He does have multiple medical problems but has not seen a doctor since April of last year and has not been taking any medications since around that time.    ED Course: In the ED, his BP was elevated to the [id number] systolic.  He had a low O2 saturation on admission, but is now saturating in the 90s on RA.  He had a CXR which showed a right sided pleural effusion with possible pneumonia and a right upper lobe pulmonary nodule.  Cr was up to 5.15 from a baseline aroudn 4, BUN of 37.  WBC 10.5 with a Hgb of 10.9.  UA did not show any signs of infection.  CT head done showed chronic mild changes and a possible meningioma.  Recommended MRI brain imaging for follow up.    Review of Systems: As per HPI otherwise 10 point review of systems negative.   Specifically denied chest pain, fever, chills, nausea, vomiting, sinus tenderness, sore throat, swelling or weight gain.  Other issues are noted in HPI.    Past Medical History:  Diagnosis Date  . Anemia      low iron  . Cancer ([place])    skin cancer  . Chronic kidney disease (CKD)    Stage 4  . COPD (chronic obstructive pulmonary disease) ([place])   . Diabetes mellitus without complication ([place])    type 2  . History of hiatal hernia    hx of   . Hypertension   . Iron deficiency   . Pneumonia   . Renal disorder    One Kidney  . Shortness of breath dyspnea    with exertion  . Sleep apnea    does not use cpap    Past Surgical History:  Procedure Laterality Date  . COLONOSCOPY    . EYE SURGERY     cataract surgery - one eye  . KIDNEY SURGERY Left    kidney removed     reports that he has been smoking Cigarettes.  He has a 120.00 pack-year smoking history. He has quit using smokeless tobacco. He reports that he does not drink alcohol or use drugs.  Confirmed that he continues to smoke, up until 2 weeks ago he was smoking about 2 packs per day.  Since being ill he is smoking about 12 cigarettes per day.   Allergies  Allergen Reactions  . Feraheme [Ferumoxytol] Nausea And Vomiting and Other (See Comments)  Chest pain, back pain, diaphoresis    Family History  Problem Relation Age of Onset  . Diabetes Mellitus II Mother    NOT Taking any medications Prior to Admission medications   Medication Sig Start Date End Date Taking? Authorizing Provider  amLODipine (NORVASC) 5 MG tablet Take 5 mg by mouth daily.    Historical Provider, MD  cholecalciferol (VITAMIN D) 1000 UNITS tablet Take 1,000 Units by mouth daily.    Historical Provider, MD  furosemide (LASIX) 40 MG tablet Take 40 mg by mouth 2 (two) times daily.     Historical Provider, MD  insulin aspart (NOVOLOG) 100 UNIT/ML injection Inject 15 Units into the skin 2 (two) times daily.    Historical Provider, MD  Insulin Degludec (TRESIBA FLEXTOUCH) 100 UNIT/ML SOPN Inject 80 Units into the skin every evening.    Historical Provider, MD  IRON PO Take 2 tablets by mouth daily.    Historical Provider, MD  metoprolol tartrate  (LOPRESSOR) 25 MG tablet Take 25 mg by mouth 2 (two) times daily.    Historical Provider, MD  promethazine (PHENERGAN) 12.5 MG tablet Take 12.5 mg by mouth 3 (three) times daily as needed for nausea or vomiting.    Historical Provider, MD  sulfamethoxazole-trimethoprim (BACTRIM DS,SEPTRA DS) 800-160 MG tablet Take 1 tablet by mouth 2 (two) times daily.    Historical Provider, MD    Physical Exam: Vitals:   02/07/17 1700 02/07/17 1836 02/07/17 1837 02/07/17 2033  BP: 168/85 167/73  179/92  Pulse: 77 99 73 81  Resp: 24 19 16 24   Temp:      TempSrc:      SpO2: 99% (!) 82% 99% 98%      Constitutional: NAD, calm, appears fatigued Vitals:   02/07/17 1700 02/07/17 1836 02/07/17 1837 02/07/17 2033  BP: 168/85 167/73  179/92  Pulse: 77 99 73 81  Resp: 24 19 16 24   Temp:      TempSrc:      SpO2: 99% (!) 82% 99% 98%   Eyes: PERRL, lids and conjunctivae normal ENMT: Mucous membranes are moist. Posterior pharynx clear of any exudate or lesions.  Neck: normal, supple, Painful submandibular lymph node, mobile Respiratory: decreased breath sounds in right base, otherwise clear without wheezing or crackles. Breathing comfortably  Cardiovascular: Regular rate and rhythm, no murmurs / rubs / gallops. No extremity edema.   Abdomen: no tenderness, no masses palpated. . Bowel sounds positive.  Musculoskeletal:  No joint deformity upper and lower extremities. Thin extremities Skin: Solar damage, some chronic skin changes on arms and legs Neurologic: CN 2-12 grossly intact. Sensation intact, Strength 5/5 in all 4.  Psychiatric: Normal judgment and insight. Alert and oriented x 3. fatigued  Labs on Admission: I have personally reviewed following labs and imaging studies  CBC:  Recent Labs Lab 02/07/17 1533  WBC 10.5  HGB 10.9*  HCT 34.5*  MCV 78.9  PLT [id number]   Basic Metabolic Panel:  Recent Labs Lab 02/07/17 1533  NA 137  K 4.0  CL 103  CO2 25  GLUCOSE 126*  BUN 37*  CREATININE  5.15*  CALCIUM 11.8*  MG 2.2   GFR: CrCl cannot be calculated (Unknown ideal weight.). Liver Function Tests: No results for input(s): AST, ALT, ALKPHOS, BILITOT, PROT, ALBUMIN in the last 168 hours. No results for input(s): LIPASE, AMYLASE in the last 168 hours. No results for input(s): AMMONIA in the last 168 hours. Coagulation Profile: No results for input(s): INR, PROTIME in the  last 168 hours. Cardiac Enzymes: No results for input(s): CKTOTAL, CKMB, CKMBINDEX, TROPONINI in the last 168 hours. BNP (last 3 results) No results for input(s): PROBNP in the last 8760 hours. HbA1C: No results for input(s): HGBA1C in the last 72 hours. CBG:  Recent Labs Lab 02/07/17 1522  GLUCAP 126*   Lipid Profile: No results for input(s): CHOL, HDL, LDLCALC, TRIG, CHOLHDL, LDLDIRECT in the last 72 hours. Thyroid Function Tests: No results for input(s): TSH, T4TOTAL, FREET4, T3FREE, THYROIDAB in the last 72 hours. Anemia Panel: No results for input(s): VITAMINB12, FOLATE, FERRITIN, TIBC, IRON, RETICCTPCT in the last 72 hours. Urine analysis:    Component Value Date/Time   COLORURINE YELLOW 02/07/2017 1742   APPEARANCEUR CLEAR 02/07/2017 1742   LABSPEC 1.014 02/07/2017 1742   PHURINE 5.0 02/07/2017 1742   GLUCOSEU 150 (A) 02/07/2017 1742   HGBUR NEGATIVE 02/07/2017 1742   BILIRUBINUR NEGATIVE 02/07/2017 1742   KETONESUR NEGATIVE 02/07/2017 1742   PROTEINUR >=300 (A) 02/07/2017 1742   UROBILINOGEN 0.2 06/19/2010 1039   NITRITE NEGATIVE 02/07/2017 1742   LEUKOCYTESUR NEGATIVE 02/07/2017 1742   Sepsis Labs: !!!!!!!!!!!!!!!!!!!!!!!!!!!!!!!!!!!!!!!!!!!! @LABRCNTIP (procalcitonin:4,lacticidven:4) )No results found for this or any previous visit (from the past 240 hour(s)).   Radiological Exams on Admission: Dg Chest 2 View  Result Date: 02/07/2017 CLINICAL DATA:  Weakness started this morning with productive cough. EXAM: CHEST  2 VIEW COMPARISON:  10/29/2015 FINDINGS: There is a small  right pleural effusion. There is right basilar airspace disease. The left lung is clear. There is no pneumothorax. 3 cm nodular opacity in the superior segment of the right upper lobe. There is no pleural effusion or pneumothorax. The heart and mediastinal contours are unremarkable. The osseous structures are unremarkable. IMPRESSION: 1. Small right pleural effusion and right basilar airspace disease which may reflect atelectasis versus pneumonia. Followup PA and lateral chest X-ray is recommended in 3-4 weeks following trial of antibiotic therapy to ensure resolution and exclude underlying malignancy. 2. 3 cm nodular opacity in the superior segment of the right upper lobe which may reflect round pneumonia versus pulmonary nodule. Followup PA and lateral chest X-ray is recommended in 3-4 weeks following trial of antibiotic therapy to ensure resolution and exclude underlying malignancy. Electronically Signed   By: Kathreen Devoid   On: 02/07/2017 15:52   Ct Head Wo Contrast  Result Date: 02/07/2017 CLINICAL DATA:  Weakness EXAM: CT HEAD WITHOUT CONTRAST TECHNIQUE: Contiguous axial images were obtained from the base of the skull through the vertex without intravenous contrast. COMPARISON:  02/12/2012 FINDINGS: Brain: Mild atrophic changes are noted. Chronic white matter ischemic changes are seen and stable. There a is a 2.1 x 3.5 x 3.8 cm partially calcified lesion arising in the region of the sylvian fissure on the right. It appears to be related to the right sphenoid wing. Vascular: No hyperdense vessel or unexpected calcification. Skull: Normal. Negative for fracture or focal lesion. Sinuses/Orbits: No acute finding. Other: None. IMPRESSION: Mild atrophic and ischemic changes. Partially calcified mass lesion arising in the region of the sylvian fissure on the right. This has the appearance of a meningioma and was not well visualized on the prior exam. Nonemergent MRI would be helpful for confirmation.  Electronically Signed   By: Inez Catalina M.D.   On: 02/07/2017 17:46    EKG: Independently reviewed. Sinus rhythm, RBBB (old)  Assessment/Plan CAP - cough, weakness, chills could be a sign of a viral illness with subsequent pneumonia - Treat as community acquired with rocephin and  azithromycin - Pleural effusion noted and possibly related to pneumonia vs. Volume overload with worsening renal function - Strep pneumo urinary antigen - BC X 2 - Sputum Culture  - Flu panel, droplet precautions - HIV   Malaise - Likely multifactorial with worsening renal function, likely infection and decreased PO intake - Nodular opacity on CXR is concerning given history of smoking, will attempt to treat as pneumonia and repeat CXR - Check TSH - Check for infection as above - Telemetry  Stage 4 chronic renal impairment associated with type 2 diabetes mellitus - Worsened, Cr now 5.  - He apparently was advised to prepare to start dialysis last year, and then he quit going to the doctor - IVF with NS at 100cc/hr overnight, low threshold to give a dose of lasix if becoming volume overloaded or with new oxygen requirement - Trend BMET - Daily weights, strict I/O  Constipation - He has had decreased PO intake, so could be related to no stool production - Abdominal xray  Hypertension - Consider starting amlodipine back if BP averages > [id number] systolic - Telemetry    Diabetes mellitus with renal failure - Blood sugars in the 120s on admission today - He reports decreased PO intake - SSI, carb modified diet.    Tobacco abuse - Nicotine patch  Pulmonary nodule - Follow up CXR after therapy for pneumonia  Possible meningioma - Seen on CT head, MRI without contrast ordered  COPD - Duonebs ordered  Anemia - previously with iron deficiency, supposed to be on PO iron - Trend CBC - Iron, TIBC and ferritin with AML   DVT prophylaxis: Heparin Code Status: Full Family Communication: Wife at  bedside Disposition Plan: Pending improvement - given weakness will likely need SNF on discharge Consults called: Neurohospitalist by ED - has not seen yet Admission status: Med-Surg, inpatient   Gilles Chiquito MD [facility] Pager [phone]  If 7PM-7AM, please contact night-coverage [email or web address] Password [facility]  02/07/2017, 9:34 PM

## 2017-02-07 NOTE — ED Notes (Signed)
Patient transported to X-ray 

## 2017-02-07 NOTE — ED Triage Notes (Addendum)
Per EMS, pt from home.  Pt c/o weakness.  Pt has baseline dementia.  Pt woke up this morning telling family that he felt weak.  Pt denies n/v/d.  No fever.  Pt has had productive cough.  Has COPD and is continual smoker.  Vitals:  170/110, hr 77, 96% ra, cbg 136

## 2017-02-07 NOTE — ED Notes (Signed)
Bed: WA03 Expected date:  Expected time:  Means of arrival:  Comments: 81 yo weakness

## 2017-02-07 NOTE — ED Triage Notes (Signed)
Per family, pt has not been eating.  Pt has been staying in bed.  States he is sick on his stomach.

## 2017-02-08 ENCOUNTER — Inpatient Hospital Stay (HOSPITAL_COMMUNITY): Payer: Medicare HMO

## 2017-02-08 DIAGNOSIS — R0600 Dyspnea, unspecified: Secondary | ICD-10-CM

## 2017-02-08 LAB — BASIC METABOLIC PANEL
Anion gap: 11 (ref 5–15)
BUN: 38 mg/dL — AB (ref 6–20)
CHLORIDE: 103 mmol/L (ref 101–111)
CO2: 24 mmol/L (ref 22–32)
Calcium: 11.9 mg/dL — ABNORMAL HIGH (ref 8.9–10.3)
Creatinine, Ser: 4.98 mg/dL — ABNORMAL HIGH (ref 0.61–1.24)
GFR, EST AFRICAN AMERICAN: 12 mL/min — AB (ref >60)
GFR, EST NON AFRICAN AMERICAN: 10 mL/min — AB (ref >60)
Glucose, Bld: 134 mg/dL — ABNORMAL HIGH (ref 65–99)
POTASSIUM: 3.9 mmol/L (ref 3.5–5.1)
SODIUM: 138 mmol/L (ref 135–145)

## 2017-02-08 LAB — IRON AND TIBC
Iron: 17 ug/dL — ABNORMAL LOW (ref 45–182)
Saturation Ratios: 7 % — ABNORMAL LOW (ref 17.9–39.5)
TIBC: 237 ug/dL — AB (ref 250–450)
UIBC: 220 ug/dL

## 2017-02-08 LAB — CBC
HEMATOCRIT: 34.8 % — AB (ref 39.0–52.0)
Hemoglobin: 11.1 g/dL — ABNORMAL LOW (ref 13.0–17.0)
MCH: 25.2 pg — ABNORMAL LOW (ref 26.0–34.0)
MCHC: 31.9 g/dL (ref 30.0–36.0)
MCV: 78.9 fL (ref 78.0–100.0)
Platelets: 321 10*3/uL (ref 150–400)
RBC: 4.41 MIL/uL (ref 4.22–5.81)
RDW: 15.2 % (ref 11.5–15.5)
WBC: 11.8 10*3/uL — ABNORMAL HIGH (ref 4.0–10.5)

## 2017-02-08 LAB — INFLUENZA PANEL BY PCR (TYPE A & B)
INFLBPCR: NEGATIVE
Influenza A By PCR: NEGATIVE

## 2017-02-08 LAB — ECHOCARDIOGRAM COMPLETE
HEIGHTINCHES: 69 in
Weight: 3202.84 oz

## 2017-02-08 LAB — GLUCOSE, CAPILLARY
GLUCOSE-CAPILLARY: 113 mg/dL — AB (ref 65–99)
GLUCOSE-CAPILLARY: 131 mg/dL — AB (ref 65–99)
GLUCOSE-CAPILLARY: 141 mg/dL — AB (ref 65–99)

## 2017-02-08 LAB — FERRITIN: FERRITIN: 54 ng/mL (ref 24–336)

## 2017-02-08 LAB — HIV ANTIBODY (ROUTINE TESTING W REFLEX): HIV Screen 4th Generation wRfx: NONREACTIVE

## 2017-02-08 LAB — STREP PNEUMONIAE URINARY ANTIGEN: Strep Pneumo Urinary Antigen: NEGATIVE

## 2017-02-08 MED ORDER — PNEUMOCOCCAL VAC POLYVALENT 25 MCG/0.5ML IJ INJ
0.5000 mL | INJECTION | INTRAMUSCULAR | Status: AC
  Administered 2017-02-10: 0.5 mL via INTRAMUSCULAR
  Filled 2017-02-08: qty 0.5

## 2017-02-08 MED ORDER — FERROUS SULFATE 325 (65 FE) MG PO TABS
325.0000 mg | ORAL_TABLET | Freq: Two times a day (BID) | ORAL | Status: DC
  Administered 2017-02-10 (×2): 325 mg via ORAL
  Filled 2017-02-08 (×3): qty 1

## 2017-02-08 MED ORDER — INFLUENZA VAC SPLIT QUAD 0.5 ML IM SUSY
0.5000 mL | PREFILLED_SYRINGE | INTRAMUSCULAR | Status: AC
  Administered 2017-02-10: 0.5 mL via INTRAMUSCULAR
  Filled 2017-02-08: qty 0.5

## 2017-02-08 MED ORDER — NICOTINE 14 MG/24HR TD PT24
14.0000 mg | MEDICATED_PATCH | Freq: Every day | TRANSDERMAL | Status: DC
  Administered 2017-02-08 – 2017-02-10 (×3): 14 mg via TRANSDERMAL
  Filled 2017-02-08 (×4): qty 1

## 2017-02-08 MED ORDER — POLYETHYLENE GLYCOL 3350 17 G PO PACK
17.0000 g | PACK | Freq: Every day | ORAL | Status: DC
  Administered 2017-02-08 – 2017-02-10 (×3): 17 g via ORAL
  Filled 2017-02-08 (×4): qty 1

## 2017-02-08 NOTE — Progress Notes (Signed)
Echocardiogram 2D Echocardiogram has been performed.  Steven Trevino 02/08/2017, 4:05 PM

## 2017-02-08 NOTE — Progress Notes (Signed)
PROGRESS NOTE    Steven Trevino  [id number] DOB: 1936/01/15 DOA: 02/07/2017 PCP: Imagene Riches, NP   Chief Complaint  Patient presents with  . Weakness    Brief Narrative:  HPI on 02/07/2017 by Dr. Luberta Robertson is a 81 y.o. male with medical history significant of HTN, low iron, DM2, CKD stage V, COPD, some underlying dementia who presents for 2 weeks of weakness and malaise.  He and his wife note that about 2 weeks ago he became so weak that he just didn't want to get out of bed.  Since that time he has been mainly in bed and only getting up to go to the bathroom.  He has had less PO intake, decreased UOP, cough productive of a white sputum for the last 2-3 days, SOB with walking, dizziness and lightheadedness.  He has not had a BM in 10 days either, but has no abdominal pain, nausea, vomiting or diarrhea.  He denies sick contacts, headache, vision change, focal weakness, travel, chest pain, problems swallowing.  He has not been sick like this before.  He does have multiple medical problems but has not seen a doctor since April of last year and has not been taking any medications since around that time.   Assessment & Plan   Community Acquire Pneumonia -Presented with cough, chills, weakness -CXR showed Small right pleural effusion and right basilar airspace disease which may reflect atelectasis versus pneumonia. Recommended Xray in 3-4 weeks.  -Continue azithromycin and ceftriaxone -Strep pneumonia urine antigen negative -Blood and sputum cultures pending  -HIV nonreactive, Influenza neative   Dyspnea with exertion -Possibly related to pneumonia vs unknown CHF -Will obtain echocardiogram  Malaise -Likely multifactorial with worsening renal function, likely infection and decreased PO intake -Nodular opacity on CXR is concerning given history of smoking, will attempt to treat as pneumonia and repeat CXR -TSH pending  Chronic kidney disease, stage IV-IV -Baseline  creatinine around 4, upon admission 4.98 -Patient used to follow with Dr. Posey Pronto -He was supposed to have access last year, however, did not. -Now states he would be open to HD -Nephrology consulted and appreciated -Continue to monitor intake and output -Monitor BMP  Constipation -He has had decreased PO intake, so could be related to no stool production -Abdominal xray: Nonobstructive bowel gas pattern with stool filled colon -Will add on miralax  Hypertension -Restart amlodipine     Diabetes mellitus, type II  with renal failure -Continue ISS and CBG monitoring  -Will obtain Hemoglobin A1c  Tobacco abuse -Discussed smoking cessation -Continue Nicotine patch  Pulmonary nodule -CXR 3 cm nodular opacity in the superior segment of the right upper lobe which may reflect round pneumonia versus pulmonary nodule. Needs repeat CXR in 3-4 weeks.  Possible meningioma -Seen on CT head -MRI brain: 33 x 22 x 36 mm meningioma at the right insula. Mild vasogenic edema versus asymmetry small-vessel disease in the deformed right superior temporal lobe. -Spoke with Dr. Trenton Gammon, neurosurgery. Recommended outpatient surveillance and follow up. Unlikely cause of patient's weakness. Probably incidental finding. Did not recommend steroids.   COPD -Duonebs ordered  Anemia -previously with iron deficiency, supposed to be on PO iron -Hemoglobin currently 11.1 -Anemia panel: Iron 17, Ferritin 54 -will place on oral iron   DVT Prophylaxis  heparin  Code Status: Full  Family Communication: Wife at bedside  Disposition Plan: Admitted  Consultants Nephrology Neurosurgery, Dr. Trenton Gammon, via phone  Procedures  None  Antibiotics   Anti-infectives  Start     Dose/Rate Route Frequency Ordered Stop   02/08/17 1800  cefTRIAXone (ROCEPHIN) 1 g in dextrose 5 % 50 mL IVPB     1 g 100 mL/hr over 30 Minutes Intravenous Every 24 hours 02/07/17 2230 02/15/17 1759   02/07/17 2245  azithromycin  (ZITHROMAX) tablet 500 mg     500 mg Oral Daily at bedtime 02/07/17 2230 02/14/17 2159   02/07/17 1700  cefTRIAXone (ROCEPHIN) 1 g in dextrose 5 % 50 mL IVPB     1 g 100 mL/hr over 30 Minutes Intravenous  Once 02/07/17 1647 02/07/17 1840      Subjective:   Steven Trevino seen and examined today.  Feels his breathing is better. Still has cough. Still feels weak. Denies chest pain, abdominal pain, headache, dizziness.     Objective:   Vitals:   02/07/17 2253 02/07/17 2352 02/08/17 0500 02/08/17 0517  BP:  (!) 177/81  (!) 170/77  Pulse:  72  79  Resp:    18  Temp:  97.4 F (36.3 C)  97.8 F (36.6 C)  TempSrc:  Oral  Oral  SpO2: 98% 97%  98%  Weight:  90.9 kg (200 lb 6.4 oz) 90.8 kg (200 lb 2.8 oz)   Height:  5\' 9"  (1.753 m)      Intake/Output Summary (Last 24 hours) at 02/08/17 1229 Last data filed at 02/08/17 0600  Gross per 24 hour  Intake              775 ml  Output                0 ml  Net              775 ml   Filed Weights   02/07/17 2352 02/08/17 0500  Weight: 90.9 kg (200 lb 6.4 oz) 90.8 kg (200 lb 2.8 oz)    Exam  General: Well developed, well nourished, NAD, appears stated age  HEENT: NCAT, mucous membranes moist.   Neck: Supple, no JVD, submandibular lymph node.  Cardiovascular: S1 S2 auscultated, no rubs, murmurs or gallops. Regular rate and rhythm.  Respiratory: Diminished breath sounds anteriorly   Abdomen: Soft, nontender, nondistended, + bowel sounds  Extremities: warm dry without cyanosis clubbing or edema  Neuro: AAOx3, nonfocal  Skin: Without rashes exudates or nodules- chronic skin changes  Psych: Appropriate   Data Reviewed: I have personally reviewed following labs and imaging studies  CBC:  Recent Labs Lab 02/07/17 1533 02/08/17 0102  WBC 10.5 11.8*  HGB 10.9* 11.1*  HCT 34.5* 34.8*  MCV 78.9 78.9  PLT 314 [id number]   Basic Metabolic Panel:  Recent Labs Lab 02/07/17 1533 02/08/17 0102  NA 137 138  K 4.0 3.9  CL 103 103    CO2 25 24  GLUCOSE 126* 134*  BUN 37* 38*  CREATININE 5.15* 4.98*  CALCIUM 11.8* 11.9*  MG 2.2  --    GFR: Estimated Creatinine Clearance: 13.6 mL/min (by C-G formula based on SCr of 4.98 mg/dL (H)). Liver Function Tests: No results for input(s): AST, ALT, ALKPHOS, BILITOT, PROT, ALBUMIN in the last 168 hours. No results for input(s): LIPASE, AMYLASE in the last 168 hours. No results for input(s): AMMONIA in the last 168 hours. Coagulation Profile: No results for input(s): INR, PROTIME in the last 168 hours. Cardiac Enzymes: No results for input(s): CKTOTAL, CKMB, CKMBINDEX, TROPONINI in the last 168 hours. BNP (last 3 results) No results for input(s): PROBNP in the last  8760 hours. HbA1C: No results for input(s): HGBA1C in the last 72 hours. CBG:  Recent Labs Lab 02/07/17 1522 02/07/17 2340 02/08/17 0841 02/08/17 1127  GLUCAP 126* 132* 113* 131*   Lipid Profile: No results for input(s): CHOL, HDL, LDLCALC, TRIG, CHOLHDL, LDLDIRECT in the last 72 hours. Thyroid Function Tests: No results for input(s): TSH, T4TOTAL, FREET4, T3FREE, THYROIDAB in the last 72 hours. Anemia Panel:  Recent Labs  02/08/17 0102  FERRITIN 54  TIBC 237*  IRON 17*   Urine analysis:    Component Value Date/Time   COLORURINE YELLOW 02/07/2017 1742   APPEARANCEUR CLEAR 02/07/2017 1742   LABSPEC 1.014 02/07/2017 1742   PHURINE 5.0 02/07/2017 1742   GLUCOSEU 150 (A) 02/07/2017 1742   HGBUR NEGATIVE 02/07/2017 1742   BILIRUBINUR NEGATIVE 02/07/2017 1742   KETONESUR NEGATIVE 02/07/2017 1742   PROTEINUR >=300 (A) 02/07/2017 1742   UROBILINOGEN 0.2 06/19/2010 1039   NITRITE NEGATIVE 02/07/2017 1742   LEUKOCYTESUR NEGATIVE 02/07/2017 1742   Sepsis Labs: @LABRCNTIP (procalcitonin:4,lacticidven:4)  )No results found for this or any previous visit (from the past 240 hour(s)).    Radiology Studies: Dg Chest 2 View  Result Date: 02/07/2017 CLINICAL DATA:  Weakness started this morning with  productive cough. EXAM: CHEST  2 VIEW COMPARISON:  10/29/2015 FINDINGS: There is a small right pleural effusion. There is right basilar airspace disease. The left lung is clear. There is no pneumothorax. 3 cm nodular opacity in the superior segment of the right upper lobe. There is no pleural effusion or pneumothorax. The heart and mediastinal contours are unremarkable. The osseous structures are unremarkable. IMPRESSION: 1. Small right pleural effusion and right basilar airspace disease which may reflect atelectasis versus pneumonia. Followup PA and lateral chest X-ray is recommended in 3-4 weeks following trial of antibiotic therapy to ensure resolution and exclude underlying malignancy. 2. 3 cm nodular opacity in the superior segment of the right upper lobe which may reflect round pneumonia versus pulmonary nodule. Followup PA and lateral chest X-ray is recommended in 3-4 weeks following trial of antibiotic therapy to ensure resolution and exclude underlying malignancy. Electronically Signed   By: Kathreen Devoid   On: 02/07/2017 15:52   Abd 1 View (kub)  Result Date: 02/07/2017 CLINICAL DATA:  Constipation for 2 weeks. Abdominal pain and bloating. Weakness. History of diabetes and dementia. EXAM: ABDOMEN - 1 VIEW COMPARISON:  None. FINDINGS: Scattered gas and stool throughout the colon. No small or large bowel distention. Calcifications in the left upper quadrant likely represent splenic granulomas. Calcification projected over the right kidney may represent a small renal stone. Vascular calcifications. Degenerative changes in the spine and hips. Pleural thickening or effusion on the right with atelectasis or scarring in the right lung base. IMPRESSION: Nonobstructive bowel gas pattern with stool-filled colon. Atelectasis or scarring in the right lung base with fluid or thickened pleura in the right costophrenic angle. Electronically Signed   By: Lucienne Capers M.D.   On: 02/07/2017 23:02   Ct Head Wo  Contrast  Result Date: 02/07/2017 CLINICAL DATA:  Weakness EXAM: CT HEAD WITHOUT CONTRAST TECHNIQUE: Contiguous axial images were obtained from the base of the skull through the vertex without intravenous contrast. COMPARISON:  02/12/2012 FINDINGS: Brain: Mild atrophic changes are noted. Chronic white matter ischemic changes are seen and stable. There a is a 2.1 x 3.5 x 3.8 cm partially calcified lesion arising in the region of the sylvian fissure on the right. It appears to be related to the right sphenoid  wing. Vascular: No hyperdense vessel or unexpected calcification. Skull: Normal. Negative for fracture or focal lesion. Sinuses/Orbits: No acute finding. Other: None. IMPRESSION: Mild atrophic and ischemic changes. Partially calcified mass lesion arising in the region of the sylvian fissure on the right. This has the appearance of a meningioma and was not well visualized on the prior exam. Nonemergent MRI would be helpful for confirmation. Electronically Signed   By: Inez Catalina M.D.   On: 02/07/2017 17:46   Mr Brain Wo Contrast  Result Date: 02/08/2017 CLINICAL DATA:  Weakness.  Unable to ambulate.  Meningioma. EXAM: MRI HEAD WITHOUT CONTRAST TECHNIQUE: Multiplanar, multiecho pulse sequences of the brain and surrounding structures were obtained without intravenous contrast. COMPARISON:  Yesterday FINDINGS: Brain: The partially calcified mass along the right insula is identified is an isointense dural-based lesion measuring 33 x 22 x 36 mm. There is mild to moderate mass effect on the superior temporal lobe and frontal operculum. Asymmetric edematous signal within the white matter of the superior temporal lobe without convincing gyrus swelling. Findings are most consistent with meningioma. Atrophy with ventriculomegaly. Chronic small-vessel disease with confluent ischemic gliosis in the cerebral white matter. Single focus of chronic blood products in the posterior left cerebral hemisphere. Vascular:  Asymmetric loss of flow void in the right transverse sigmoid sinus and upper internal jugular vein shows a more normalized appearance on coronal T2 weighted acquisition. No acute thrombosis suspected. Skull and upper cervical spine: No marrow lesion noted. Sinuses/Orbits: Right cataract resection.  No acute finding. Other: Motion degraded study. IMPRESSION: 1. 33 x 22 x 36 mm meningioma at the right insula. Mild vasogenic edema versus asymmetry small-vessel disease in the deformed right superior temporal lobe. 2. Prominent atrophy and chronic microvascular disease. Electronically Signed   By: Monte Fantasia M.D.   On: 02/08/2017 08:40     Scheduled Meds: . azithromycin  500 mg Oral QHS  . cefTRIAXone (ROCEPHIN)  IV  1 g Intravenous Q24H  . docusate sodium  100 mg Oral BID  . heparin  5,000 Units Subcutaneous Q8H  . [START ON 02/09/2017] Influenza vac split quadrivalent PF  0.5 mL Intramuscular Tomorrow-1000  . insulin aspart  0-5 Units Subcutaneous QHS  . insulin aspart  0-9 Units Subcutaneous TID WC  . ipratropium-albuterol  3 mL Nebulization TID  . nicotine  14 mg Transdermal Daily  . [START ON 02/09/2017] pneumococcal 23 valent vaccine  0.5 mL Intramuscular Tomorrow-1000   Continuous Infusions:   LOS: 1 day   Time Spent in minutes   30 minutes  Deondrae Mcgrail D.O. on 02/08/2017 at 12:29 PM  Between 7am to 7pm - Pager - [phone]  After 7pm go to [email or web address] - password [email or web address]  And look for the night coverage person covering for me after hours  [facility] Office  [phone]

## 2017-02-08 NOTE — Consult Note (Signed)
[facility] Consult Note     Date: 02/08/2017                  Patient Name:  Steven Trevino  MRN: [id number]  DOB: 1936-11-23  Age / Sex: 81 y.o., male         PCP: Imagene Riches, NP                 Service Requesting Consult: [facility]                 Reason for Consult: CKD GV A3            Chief Complaint: weakness  HPI: Pt is a 68M with a PMH significant for HTN, DM II , HLD, CKD G V A3, COPD, s/p L nephrectomy for oncocytoma, and h/o skin cancers who is now seen at the request of Dr. Ree Kida for evaluation and recommendations surrounding CKD.  Briefly, pt presented with weakness.  Appears to have a small R pleural effusion and a possible CAP and is being treated with azithro/ CTX.  MRI brain performed which showed a possible meningioma.  He has been followed by Dr. Posey Pronto and was last seen in the office 01/2016.  Last outpatient labs 01/31/2016 show a creatinine of 5.06 with a BUN of 53.  He was scheduled for a fistula placement in 03/2016 as an outpatient 3x and it was cancelled each time.    Unfortunately he does not have any family with him today.  He is sleepy but is arousable.  When asked why he didn't go to any doctor's visits, he says "I just didn't feel I needed to go."  Past Medical History:  Diagnosis Date  . Anemia    low iron  . Cancer ([place])    skin cancer  . Chronic kidney disease (CKD)    Stage 4  . COPD (chronic obstructive pulmonary disease) ([place])   . Diabetes mellitus without complication ([place])    type 2  . History of hiatal hernia    hx of   . Hypertension   . Iron deficiency   . Pneumonia   . Renal disorder    One Kidney  . Shortness of breath dyspnea    with exertion  . Sleep apnea    does not use cpap    Past Surgical History:  Procedure Laterality Date  . COLONOSCOPY    . EYE SURGERY     cataract surgery - one eye  . KIDNEY SURGERY Left    kidney removed    Family History  Problem Relation Age of Onset  .  Diabetes Mellitus II Mother    Social History:  reports that he has been smoking Cigarettes.  He has a 120.00 pack-year smoking history. He has quit using smokeless tobacco. He reports that he does not drink alcohol or use drugs.  Allergies:  Allergies  Allergen Reactions  . Feraheme [Ferumoxytol] Nausea And Vomiting and Other (See Comments)    Chest pain, back pain, diaphoresis    Medications Prior to Admission  Medication Sig Dispense Refill  . amLODipine (NORVASC) 5 MG tablet Take 5 mg by mouth daily.    . cholecalciferol (VITAMIN D) 1000 UNITS tablet Take 1,000 Units by mouth daily.    . furosemide (LASIX) 40 MG tablet Take 40 mg by mouth 2 (two) times daily.     . insulin aspart (NOVOLOG) 100 UNIT/ML injection Inject 15 Units into the skin 2 (two) times  daily.    . Insulin Degludec (TRESIBA FLEXTOUCH) 100 UNIT/ML SOPN Inject 80 Units into the skin every evening.    . IRON PO Take 2 tablets by mouth daily.    . metoprolol tartrate (LOPRESSOR) 25 MG tablet Take 25 mg by mouth 2 (two) times daily.    . promethazine (PHENERGAN) 12.5 MG tablet Take 12.5 mg by mouth 3 (three) times daily as needed for nausea or vomiting.    . sulfamethoxazole-trimethoprim (BACTRIM DS,SEPTRA DS) 800-160 MG tablet Take 1 tablet by mouth 2 (two) times daily.      Results for orders placed or performed during the hospital encounter of 02/07/17 (from the past 48 hour(s))  CBG monitoring, ED     Status: Abnormal   Collection Time: 02/07/17  3:22 PM  Result Value Ref Range   Glucose-Capillary 126 (H) 65 - 99 mg/dL  Basic metabolic panel     Status: Abnormal   Collection Time: 02/07/17  3:33 PM  Result Value Ref Range   Sodium 137 135 - 145 mmol/L   Potassium 4.0 3.5 - 5.1 mmol/L   Chloride 103 101 - 111 mmol/L   CO2 25 22 - 32 mmol/L   Glucose, Bld 126 (H) 65 - 99 mg/dL   BUN 37 (H) 6 - 20 mg/dL   Creatinine, Ser 5.15 (H) 0.61 - 1.24 mg/dL   Calcium 11.8 (H) 8.9 - 10.3 mg/dL   GFR calc non Af Amer 10  (L) >60 mL/min   GFR calc Af Amer 11 (L) >60 mL/min    Comment: (NOTE) The eGFR has been calculated using the CKD EPI equation. This calculation has not been validated in all clinical situations. eGFR's persistently <60 mL/min signify possible Chronic Kidney Disease.    Anion gap 9 5 - 15  CBC     Status: Abnormal   Collection Time: 02/07/17  3:33 PM  Result Value Ref Range   WBC 10.5 4.0 - 10.5 K/uL   RBC 4.37 4.22 - 5.81 MIL/uL   Hemoglobin 10.9 (L) 13.0 - 17.0 g/dL   HCT 34.5 (L) 39.0 - 52.0 %   MCV 78.9 78.0 - 100.0 fL   MCH 24.9 (L) 26.0 - 34.0 pg   MCHC 31.6 30.0 - 36.0 g/dL   RDW 15.0 11.5 - 15.5 %   Platelets 314 150 - 400 K/uL  Magnesium     Status: None   Collection Time: 02/07/17  3:33 PM  Result Value Ref Range   Magnesium 2.2 1.7 - 2.4 mg/dL  Urinalysis, Routine w reflex microscopic     Status: Abnormal   Collection Time: 02/07/17  5:42 PM  Result Value Ref Range   Color, Urine YELLOW YELLOW   APPearance CLEAR CLEAR   Specific Gravity, Urine 1.014 1.005 - 1.030   pH 5.0 5.0 - 8.0   Glucose, UA 150 (A) NEGATIVE mg/dL   Hgb urine dipstick NEGATIVE NEGATIVE   Bilirubin Urine NEGATIVE NEGATIVE   Ketones, ur NEGATIVE NEGATIVE mg/dL   Protein, ur >=300 (A) NEGATIVE mg/dL   Nitrite NEGATIVE NEGATIVE   Leukocytes, UA NEGATIVE NEGATIVE   RBC / HPF 0-5 0 - 5 RBC/hpf   WBC, UA 0-5 0 - 5 WBC/hpf   Bacteria, UA RARE (A) NONE SEEN   Squamous Epithelial / LPF 0-5 (A) NONE SEEN   Mucous PRESENT    Hyaline Casts, UA PRESENT   Strep pneumoniae urinary antigen     Status: None   Collection Time: 02/07/17  5:42 PM  Result Value Ref Range   Strep Pneumo Urinary Antigen NEGATIVE NEGATIVE    Comment:        Infection due to S. pneumoniae cannot be absolutely ruled out since the antigen present may be below the detection limit of the test.   Glucose, capillary     Status: Abnormal   Collection Time: 02/07/17 11:40 PM  Result Value Ref Range   Glucose-Capillary 132 (H)  65 - 99 mg/dL  Influenza panel by PCR (type A & B)     Status: None   Collection Time: 02/08/17 12:03 AM  Result Value Ref Range   Influenza A By PCR NEGATIVE NEGATIVE   Influenza B By PCR NEGATIVE NEGATIVE    Comment: (NOTE) The Xpert Xpress Flu assay is intended as an aid in the diagnosis of  influenza and should not be used as a sole basis for treatment.  This  assay is FDA approved for nasopharyngeal swab specimens only. Nasal  washings and aspirates are unacceptable for Xpert Xpress Flu testing.   HIV antibody     Status: None   Collection Time: 02/08/17  1:02 AM  Result Value Ref Range   HIV Screen 4th Generation wRfx Non Reactive Non Reactive    Comment: (NOTE) Performed At: [facility] [place], [place] [id number] Lindon Romp MD QR:[id number]   CBC     Status: Abnormal   Collection Time: 02/08/17  1:02 AM  Result Value Ref Range   WBC 11.8 (H) 4.0 - 10.5 K/uL   RBC 4.41 4.22 - 5.81 MIL/uL   Hemoglobin 11.1 (L) 13.0 - 17.0 g/dL   HCT 34.8 (L) 39.0 - 52.0 %   MCV 78.9 78.0 - 100.0 fL   MCH 25.2 (L) 26.0 - 34.0 pg   MCHC 31.9 30.0 - 36.0 g/dL   RDW 15.2 11.5 - 15.5 %   Platelets 321 150 - 400 K/uL  Iron and TIBC     Status: Abnormal   Collection Time: 02/08/17  1:02 AM  Result Value Ref Range   Iron 17 (L) 45 - 182 ug/dL   TIBC 237 (L) 250 - 450 ug/dL   Saturation Ratios 7 (L) 17.9 - 39.5 %   UIBC 220 ug/dL    Comment: Performed at [facility], [address], [place], [place] [address]  Ferritin     Status: None   Collection Time: 02/08/17  1:02 AM  Result Value Ref Range   Ferritin 54 24 - 336 ng/mL    Comment: Performed at [facility] [address] [place], [place] [address]  Basic metabolic panel     Status: Abnormal   Collection Time: 02/08/17  1:02 AM  Result Value Ref Range   Sodium 138 135 - 145 mmol/L   Potassium 3.9 3.5 - 5.1 mmol/L   Chloride 103 101 - 111 mmol/L   CO2 24 22 - 32 mmol/L   Glucose, Bld  134 (H) 65 - 99 mg/dL   BUN 38 (H) 6 - 20 mg/dL   Creatinine, Ser 4.98 (H) 0.61 - 1.24 mg/dL   Calcium 11.9 (H) 8.9 - 10.3 mg/dL   GFR calc non Af Amer 10 (L) >60 mL/min   GFR calc Af Amer 12 (L) >60 mL/min    Comment: (NOTE) The eGFR has been calculated using the CKD EPI equation. This calculation has not been validated in all clinical situations. eGFR's persistently <60 mL/min signify possible Chronic Kidney Disease.  Anion gap 11 5 - 15  Glucose, capillary     Status: Abnormal   Collection Time: 02/08/17  8:41 AM  Result Value Ref Range   Glucose-Capillary 113 (H) 65 - 99 mg/dL  Glucose, capillary     Status: Abnormal   Collection Time: 02/08/17 11:27 AM  Result Value Ref Range   Glucose-Capillary 131 (H) 65 - 99 mg/dL   Dg Chest 2 View  Result Date: 02/07/2017 CLINICAL DATA:  Weakness started this morning with productive cough. EXAM: CHEST  2 VIEW COMPARISON:  10/29/2015 FINDINGS: There is a small right pleural effusion. There is right basilar airspace disease. The left lung is clear. There is no pneumothorax. 3 cm nodular opacity in the superior segment of the right upper lobe. There is no pleural effusion or pneumothorax. The heart and mediastinal contours are unremarkable. The osseous structures are unremarkable. IMPRESSION: 1. Small right pleural effusion and right basilar airspace disease which may reflect atelectasis versus pneumonia. Followup PA and lateral chest X-ray is recommended in 3-4 weeks following trial of antibiotic therapy to ensure resolution and exclude underlying malignancy. 2. 3 cm nodular opacity in the superior segment of the right upper lobe which may reflect round pneumonia versus pulmonary nodule. Followup PA and lateral chest X-ray is recommended in 3-4 weeks following trial of antibiotic therapy to ensure resolution and exclude underlying malignancy. Electronically Signed   By: Kathreen Devoid   On: 02/07/2017 15:52   Abd 1 View (kub)  Result Date:  02/07/2017 CLINICAL DATA:  Constipation for 2 weeks. Abdominal pain and bloating. Weakness. History of diabetes and dementia. EXAM: ABDOMEN - 1 VIEW COMPARISON:  None. FINDINGS: Scattered gas and stool throughout the colon. No small or large bowel distention. Calcifications in the left upper quadrant likely represent splenic granulomas. Calcification projected over the right kidney may represent a small renal stone. Vascular calcifications. Degenerative changes in the spine and hips. Pleural thickening or effusion on the right with atelectasis or scarring in the right lung base. IMPRESSION: Nonobstructive bowel gas pattern with stool-filled colon. Atelectasis or scarring in the right lung base with fluid or thickened pleura in the right costophrenic angle. Electronically Signed   By: Lucienne Capers M.D.   On: 02/07/2017 23:02   Ct Head Wo Contrast  Result Date: 02/07/2017 CLINICAL DATA:  Weakness EXAM: CT HEAD WITHOUT CONTRAST TECHNIQUE: Contiguous axial images were obtained from the base of the skull through the vertex without intravenous contrast. COMPARISON:  02/12/2012 FINDINGS: Brain: Mild atrophic changes are noted. Chronic white matter ischemic changes are seen and stable. There a is a 2.1 x 3.5 x 3.8 cm partially calcified lesion arising in the region of the sylvian fissure on the right. It appears to be related to the right sphenoid wing. Vascular: No hyperdense vessel or unexpected calcification. Skull: Normal. Negative for fracture or focal lesion. Sinuses/Orbits: No acute finding. Other: None. IMPRESSION: Mild atrophic and ischemic changes. Partially calcified mass lesion arising in the region of the sylvian fissure on the right. This has the appearance of a meningioma and was not well visualized on the prior exam. Nonemergent MRI would be helpful for confirmation. Electronically Signed   By: Inez Catalina M.D.   On: 02/07/2017 17:46   Mr Brain Wo Contrast  Result Date: 02/08/2017 CLINICAL DATA:   Weakness.  Unable to ambulate.  Meningioma. EXAM: MRI HEAD WITHOUT CONTRAST TECHNIQUE: Multiplanar, multiecho pulse sequences of the brain and surrounding structures were obtained without intravenous contrast. COMPARISON:  Yesterday FINDINGS:  Brain: The partially calcified mass along the right insula is identified is an isointense dural-based lesion measuring 33 x 22 x 36 mm. There is mild to moderate mass effect on the superior temporal lobe and frontal operculum. Asymmetric edematous signal within the white matter of the superior temporal lobe without convincing gyrus swelling. Findings are most consistent with meningioma. Atrophy with ventriculomegaly. Chronic small-vessel disease with confluent ischemic gliosis in the cerebral white matter. Single focus of chronic blood products in the posterior left cerebral hemisphere. Vascular: Asymmetric loss of flow void in the right transverse sigmoid sinus and upper internal jugular vein shows a more normalized appearance on coronal T2 weighted acquisition. No acute thrombosis suspected. Skull and upper cervical spine: No marrow lesion noted. Sinuses/Orbits: Right cataract resection.  No acute finding. Other: Motion degraded study. IMPRESSION: 1. 33 x 22 x 36 mm meningioma at the right insula. Mild vasogenic edema versus asymmetry small-vessel disease in the deformed right superior temporal lobe. 2. Prominent atrophy and chronic microvascular disease. Electronically Signed   By: Monte Fantasia M.D.   On: 02/08/2017 08:40    ROS: all other systems reviewed and are negative except as per HPI  Blood pressure (!) 170/77, pulse 79, temperature 97.8 F (36.6 C), temperature source Oral, resp. rate 18, height _0  (1.753 m), weight 90.8 kg (200 lb 2.8 oz), SpO2 96 %. Physical Exam  GEN: elderly man, NAD, sleeping but easily arousable HEENT EOMI, PERRL, dry MM ; edentulous NECK no JVD PULM mild R sided expiratory rhonchi, otherwise clear CV RRR, soft systolic  murmur, no r/g ABD obese, nontender, nondistended, NABS EXT no LE edema NEURO answers questions appropriately, no asterixis SKIN AK and suspicious-looking skin lesions bilateral forearms; sun-damaged skin  Assessment/Plan  1.  CKD G V A3; appears to be at baseline creatinine.  Discussions have been had about dialysis; pt apparently was ambivalent.  He had a fistula surgery scheduled 3x and it was cancelled each time.  He does not have any acute indications for dialysis at this time; ideally we would not have to start him on dialysis this admission.  When family is available, I will broach the topic of preparing for dialysis again with him and his family.  He does seem a little dry so I agree with the judicious fluids and holding Lasix.  2.  CAP: blood cultures pending, on azithro/ CTX  3.  Possible meningioma: noted on CT ; nsg being consulted  4.  Weakness: likely related to #2, TSH pending.    5.  BMMD: PTH and Vit D ordered for tomorrow AM  6.  Anemia: had allergic rxn to feraheme, ferrous sulfate BID  7.  Access: none  8.  DM II per primary  9.  COPD; per primary, does not appear to have an acute exac at this time.      Madelon Lips MD [facility] pgr [phone] 02/08/2017, 1:30 PM

## 2017-02-08 NOTE — Clinical Social Work Placement (Signed)
   CLINICAL SOCIAL WORK PLACEMENT  NOTE  Date:  02/08/2017  Patient Details  Name: Steven Trevino MRN: [id number] Date of Birth: 05-10-1936  Clinical Social Work is seeking post-discharge placement for this patient at the [place] level of care (*CSW will initial, date and re-position this form in  chart as items are completed):  Yes   Patient/family provided with [place] Work Department's list of facilities offering this level of care within the geographic area requested by the patient (or if unable, by the patient's family).  Yes   Patient/family informed of their freedom to choose among providers that offer the needed level of care, that participate in Medicare, Medicaid or managed care program needed by the patient, have an available bed and are willing to accept the patient.  Yes   Patient/family informed of [facility]'s ownership interest in [facility] and [facility], as well as of the fact that they are under no obligation to receive care at these facilities.  PASRR submitted to EDS on 02/08/17     PASRR number received on 02/08/17     Existing PASRR number confirmed on       FL2 transmitted to all facilities in geographic area requested by pt/family on 02/08/17     FL2 transmitted to all facilities within larger geographic area on       Patient informed that his/her managed care company has contracts with or will negotiate with certain facilities, including the following:            Patient/family informed of bed offers received.  Patient chooses bed at       Physician recommends and patient chooses bed at      Patient to be transferred to   on  .  Patient to be transferred to facility by       Patient family notified on   of transfer.  Name of family member notified:        PHYSICIAN Please sign FL2     Additional Comment:    _______________________________________________ Lilly Cove, LCSW 02/08/2017,  12:03 PM

## 2017-02-08 NOTE — Evaluation (Signed)
Occupational Therapy Evaluation Patient Details Name: Steven Trevino MRN: [id number] DOB: 1936/08/27 Today's Date: 02/08/2017    History of Present Illness 81 yo male admitted with Pna, R sided meningioma on MRI. Hx of dementia, HTN, DM, CKD, COPD   Clinical Impression   Pt admitted with PNA. Pt currently with functional limitations due to the deficits listed below (see OT Problem List).  Pt will benefit from skilled OT to increase their safety and independence with ADL and functional mobility for ADL to facilitate discharge to venue listed below.      Follow Up Recommendations  SNF    Equipment Recommendations  None recommended by OT       Precautions / Restrictions Precautions Precautions: Fall      Mobility Bed Mobility Overal bed mobility: Needs Assistance Bed Mobility: Supine to Sit     Supine to sit: HOB elevated;Mod assist     General bed mobility comments: Assist for trunk and LEs. Increased time.  Transfers                 General transfer comment: did not perform         ADL Overall ADL's : Needs assistance/impaired Eating/Feeding: Minimal assistance;Sitting   Grooming: Minimal assistance;Sitting   Upper Body Bathing: Moderate assistance;Sitting   Lower Body Bathing: Moderate assistance;Cueing for safety;Cueing for sequencing;Sitting/lateral leans                         General ADL Comments: pt will need ST SNF- pt agrees and realizes he needs to improve on ADL activity prior to returning home               Pertinent Vitals/Pain Pain Assessment: No/denies pain        Extremity/Trunk Assessment Upper Extremity Assessment Upper Extremity Assessment: Generalized weakness           Communication Communication Communication: No difficulties   Cognition                                      Home Living Family/patient expects to be discharged to:: Unsure Living Arrangements: Spouse/significant  other;Other relatives (grandson) Available Help at Discharge: Family Type of Home: Mobile home Home Access: Stairs to enter Entrance Stairs-Number of Steps: 5 Entrance Stairs-Rails: Right;Left Home Layout: One level               Home Equipment: Environmental consultant - 2 wheels;Cane - single point          Prior Functioning/Environment Level of Independence: Independent        Comments: per wife, pt was ambulatory without an assistive device at baseline        OT Problem List: Decreased strength;Decreased activity tolerance;Decreased knowledge of use of DME or AE;Impaired balance (sitting and/or standing)   OT Treatment/Interventions: Self-care/ADL training;DME and/or AE instruction;Patient/family education    OT Goals(Current goals can be found in the care plan section)    OT Frequency: Min 2X/week   Barriers to D/C:               End of Session Nurse Communication: Mobility status  Activity Tolerance: Patient limited by fatigue Patient left: in bed;with call bell/phone within reach   Time: 1300-1315 OT Time Calculation (min): 15 min Charges:  OT General Charges $OT Visit: 1 Procedure OT Evaluation $OT Eval Moderate Complexity: 1 Procedure G-Codes:  Betsy Pries 02/08/2017, 2:56 PM

## 2017-02-08 NOTE — Clinical Social Work Note (Signed)
Clinical Social Work Assessment  Patient Details  Name: Steven Trevino MRN: [id number] Date of Birth: 1936-10-06  Date of referral:  02/08/17               Reason for consult:  [employer], Medical sales representative sought to share information with:  Tourist information centre manager, Customer service manager, Family Supports Permission granted to share information::  Yes, Verbal Permission Granted  Name::        Agency::  SNF in [place]  Relationship::  Wife at bedside and son at bedside  Contact Information:     Housing/Transportation Living arrangements for the past 2 months:  [place] of Information:  Patient, Tourist information centre manager, Medical Team, Adult Children, Spouse Patient Interpreter Needed:  None Criminal Activity/Legal Involvement Pertinent to Current Situation/Hospitalization:  No - Comment as needed Significant Relationships:  Adult Children, Other Family Members, Spouse Lives with:  Adult Children, Spouse Do you feel safe going back to the place where you live?  No Need for family participation in patient care:  Yes (Comment)  Care giving concerns:  LCSW met with wife at the bedside after PT consult/evaluation completed.  Wife reports patient typically can walk to the bedroom to the chair and currently is requiring more assistance than she can manage.  Reports she is in agreement with PT recommendation for SNF. Reports they [place] in [facility] [place] [facility] and would like [facility] if at all possible.      Social Worker assessment / plan:  LCSW completed assessment of needs for discharge planning and facility placement. Family and patient in agreement for SNF at dc due to deconditioning and needing to rebuild his strength. LCSW completed SNF work up and fax out referrals. Will follow up with bed offers and begin insurance authorization process.  Plan: SNF pending bed offers. Family would like [facility] or [facility] if beds are available.  Employment status:   Retired Nurse, adult PT Recommendations:  [place] / Referral to community resources:  [place]  Patient/Family's Response to care:  Agreeable to plan and understanding of patient's needs  Patient/Family's Understanding of and Emotional Response to Diagnosis, Current Treatment, and Prognosis:  Wife at bedside and reports she understands barriers to DC home after hospital and current prognosis. Able to recognize her limitations and patient needs congruent with admission to SNF.  Emotional Assessment Appearance:  Appears stated age Attitude/Demeanor/Rapport:    Affect (typically observed):  Accepting, Adaptable, Pleasant Orientation:  Oriented to Self, Oriented to Place, Oriented to Situation, Oriented to  Time Alcohol / Substance use:  Not Applicable Psych involvement (Current and /or in the community):  No (Comment)  Discharge Needs  Concerns to be addressed:  Denies Needs/Concerns at this time Readmission within the last 30 days:  No Current discharge risk:  None Barriers to Discharge:  Ship broker, Continued Medical Work up   Marshell Garfinkel 02/08/2017, 11:43 AM

## 2017-02-08 NOTE — Evaluation (Signed)
Physical Therapy Evaluation Patient Details Name: LINUX EICHER MRN: [id number] DOB: 25-Jul-1936 Today's Date: 02/08/2017   History of Present Illness  81 yo male admitted with Pna, R sided meningioma on MRI. Hx of dementia, HTN, DM, CKD, COPD  Clinical Impression  On eval, pt required Mod assist for bed mobility and Min assist to stand and pivot with a RW. Pt demonstrated some difficulty attending to task. He fatigued very easily with minimal activity on today. Pt presents with general weakness, decreased activity tolerance, and impaired gait and balance. At this time, recommendation is for ST rehab at [facility]. Family present during session. Wife states that pt will have to be much stronger and requiring less assistance, than he is currently, in order for her to feel safe managing with him at home. Will follow and progress activity as tolerated.     Follow Up Recommendations SNF (unless mobility improves significantly)    Equipment Recommendations  None recommended by PT    Recommendations for Other Services       Precautions / Restrictions        Mobility  Bed Mobility Overal bed mobility: Needs Assistance Bed Mobility: Supine to Sit     Supine to sit: HOB elevated;Mod assist     General bed mobility comments: Assist for trunk and LEs. Increased time. Multimodal cueing required.   Transfers Overall transfer level: Needs assistance Equipment used: Rolling walker (2 wheeled) Transfers: Sit to/from [facility] Sit to Stand: Min assist;From elevated surface Stand pivot transfers: Min assist;From elevated surface       General transfer comment: x standing trials. On 1st attempt, pt stood ~10 seconds before abruptly sitting down (pt c/o fatigue). After brief seated rest, pt was able to stand a 2nd time and perform a stand pivot, bed to recliner, with RW. Multimoal cues required. Pt tends to push walker too far away.   Ambulation/Gait             General  Gait Details: NT-due to weakness and safety reasons. Pt fatigued very quickly with just standing and pivot on today  Stairs            Wheelchair Mobility    Modified Rankin (Stroke Patients Only)       Balance Overall balance assessment: Needs assistance         Standing balance support: Bilateral upper extremity supported Standing balance-Leahy Scale: Poor                               Pertinent Vitals/Pain Pain Assessment: No/denies pain    Home Living Family/patient expects to be discharged to:: Unsure Living Arrangements: Spouse/significant other;Other relatives (grandson) Available Help at Discharge: Family Type of Home: Mobile home Home Access: Stairs to enter Entrance Stairs-Rails: Right;Left Entrance Stairs-Number of Steps: 5 Home Layout: One level Home Equipment: Environmental consultant - 2 wheels;Cane - single point      Prior Function Level of Independence: Independent         Comments: per wife, pt was ambulatory without an assistive device at baseline     Hand Dominance        Extremity/Trunk Assessment   Upper Extremity Assessment Upper Extremity Assessment: Defer to OT evaluation    Lower Extremity Assessment Lower Extremity Assessment: Generalized weakness    Cervical / Trunk Assessment Cervical / Trunk Assessment: Normal  Communication   Communication: No difficulties  Cognition Arousal/Alertness: Awake/alert Behavior During Therapy: [facility]  for tasks assessed/performed Overall Cognitive Status: History of cognitive impairments - at baseline                      General Comments      Exercises     Assessment/Plan    PT Assessment Patient needs continued PT services  PT Problem List Decreased strength;Decreased mobility;Decreased activity tolerance;Decreased balance;Decreased knowledge of use of DME;Decreased cognition          PT Treatment Interventions DME instruction;Gait training;Therapeutic  activities;Therapeutic exercise;Patient/family education;Stair training;Balance training;Functional mobility training    PT Goals (Current goals can be found in the Care Plan section)  Acute Rehab PT Goals Patient Stated Goal: to get stronger (per family) PT Goal Formulation: With family Time For Goal Achievement: 02/22/17 Potential to Achieve Goals: Good    Frequency Min 3X/week   Barriers to discharge        Co-evaluation               End of Session Equipment Utilized During Treatment: Gait belt Activity Tolerance: Patient limited by fatigue Patient left: in chair;with call bell/phone within reach;with family/visitor present;with chair alarm set           Time: 1002-1015 PT Time Calculation (min) (ACUTE ONLY): 13 min   Charges:   PT Evaluation $PT Eval Low Complexity: 1 Procedure     PT G Codes:        Weston Anna, MPT Pager: [phone]

## 2017-02-08 NOTE — NC FL2 (Signed)
MEDICAID FL2 LEVEL OF CARE SCREENING TOOL     IDENTIFICATION  Patient Name: Steven Trevino Birthdate: 1936/06/18 Sex: male Admission Date (Current Location): 02/07/2017  [facility] and [place] Number:  Herbalist and Address:  [facility],  [place] [address], [place]      Provider Number: [id number]  Attending Physician Name and Address:  Cristal Ford, DO  Relative Name and Phone Number:       Current Level of Care: Hospital Recommended Level of Care: [place] Prior Approval Number:    Date Approved/Denied:   PASRR Number:   [id number]  Discharge Plan: SNF    Current Diagnoses: Patient Active Problem List   Diagnosis Date Noted  . CAP (community acquired pneumonia) 02/07/2017  . Essential hypertension   . Chronic kidney disease (CKD)   . Diabetes mellitus without complication ([place])   . Constipation   . Weakness   . Stage 4 chronic renal impairment associated with type 2 diabetes mellitus ([place]) 03/24/2016    Orientation RESPIRATION BLADDER Height & Weight     Self, Time, Situation, Place  Normal Incontinent Weight: 200 lb 2.8 oz (90.8 kg) Height:  5\' 9"  (175.3 cm)  BEHAVIORAL SYMPTOMS/MOOD NEUROLOGICAL BOWEL NUTRITION STATUS      Continent Diet (please see DC summary)  AMBULATORY STATUS COMMUNICATION OF NEEDS Skin   Extensive Assist Verbally Normal                       Personal Care Assistance Level of Assistance  Bathing, Feeding, Dressing Bathing Assistance: Limited assistance Feeding assistance: Independent Dressing Assistance: Limited assistance     Functional Limitations Info  Sight, Hearing, Speech Sight Info: Adequate Hearing Info: Adequate Speech Info: Adequate    SPECIAL CARE FACTORS FREQUENCY  PT (By licensed PT), OT (By licensed OT)     PT Frequency: 5x a week OT Frequency: 5x a week            Contractures Contractures Info: Not present    Additional Factors Info   Code Status, Allergies Code Status Info: Full Code Allergies Info: Feraheme Ferumoxytol           Current Medications (02/08/2017):  This is the current hospital active medication list Current Facility-Administered Medications  Medication Dose Route Frequency Provider Last Rate Last Dose  . acetaminophen (TYLENOL) tablet 650 mg  650 mg Oral Q6H PRN Sid Falcon, MD       Or  . acetaminophen (TYLENOL) suppository 650 mg  650 mg Rectal Q6H PRN Sid Falcon, MD      . azithromycin [facility]) tablet 500 mg  500 mg Oral QHS Sid Falcon, MD   500 mg at 02/07/17 2355  . bisacodyl (DULCOLAX) EC tablet 5 mg  5 mg Oral Daily PRN Sid Falcon, MD      . cefTRIAXone (ROCEPHIN) 1 g in dextrose 5 % 50 mL IVPB  1 g Intravenous Q24H Sid Falcon, MD      . docusate sodium (COLACE) capsule 100 mg  100 mg Oral BID Sid Falcon, MD   100 mg at 02/08/17 1044  . heparin injection 5,000 Units  5,000 Units Subcutaneous Q8H Sid Falcon, MD   5,000 Units at 02/08/17 0517  . [START ON 02/09/2017] Influenza vac split quadrivalent PF (FLUARIX) injection 0.5 mL  0.5 mL Intramuscular Tomorrow-1000 Sid Falcon, MD      . insulin aspart (novoLOG) injection  0-5 Units  0-5 Units Subcutaneous QHS Sid Falcon, MD      . insulin aspart (novoLOG) injection 0-9 Units  0-9 Units Subcutaneous TID WC Sid Falcon, MD   1 Units at 02/08/17 1150  . ipratropium-albuterol (DUONEB) 0.5-2.5 (3) MG/3ML nebulizer solution 3 mL  3 mL Nebulization TID Sid Falcon, MD      . nicotine (NICODERM CQ - dosed in mg/24 hours) patch 14 mg  14 mg Transdermal Daily Sid Falcon, MD   14 mg at 02/08/17 0133  . ondansetron (ZOFRAN) tablet 4 mg  4 mg Oral Q6H PRN Sid Falcon, MD       Or  . ondansetron [facility]) injection 4 mg  4 mg Intravenous Q6H PRN Sid Falcon, MD   4 mg at 02/07/17 2355  . [START ON 02/09/2017] pneumococcal 23 valent vaccine (PNU-IMMUNE) injection 0.5 mL  0.5 mL Intramuscular Tomorrow-1000 Sid Falcon, MD       Facility-Administered Medications Ordered in Other Encounters  Medication Dose Route Frequency Provider Last Rate Last Dose  . 0.9 %  sodium chloride infusion   Intravenous Continuous Rosetta Posner, MD         Discharge Medications: Please see discharge summary for a list of discharge medications.  Relevant Imaging Results:  Relevant Lab Results:   Additional Information SSN: [id number]    [place],

## 2017-02-09 LAB — BASIC METABOLIC PANEL
Anion gap: 8 (ref 5–15)
BUN: 39 mg/dL — AB (ref 6–20)
CALCIUM: 11.8 mg/dL — AB (ref 8.9–10.3)
CO2: 28 mmol/L (ref 22–32)
CREATININE: 5.04 mg/dL — AB (ref 0.61–1.24)
Chloride: 103 mmol/L (ref 101–111)
GFR calc Af Amer: 11 mL/min — ABNORMAL LOW (ref >60)
GFR, EST NON AFRICAN AMERICAN: 10 mL/min — AB (ref >60)
GLUCOSE: 109 mg/dL — AB (ref 65–99)
Potassium: 4.1 mmol/L (ref 3.5–5.1)
Sodium: 139 mmol/L (ref 135–145)

## 2017-02-09 LAB — GLUCOSE, CAPILLARY
Glucose-Capillary: 127 mg/dL — ABNORMAL HIGH (ref 65–99)
Glucose-Capillary: 103 mg/dL — ABNORMAL HIGH (ref 65–99)
Glucose-Capillary: 106 mg/dL — ABNORMAL HIGH (ref 65–99)
Glucose-Capillary: 119 mg/dL — ABNORMAL HIGH (ref 65–99)
Glucose-Capillary: 133 mg/dL — ABNORMAL HIGH (ref 65–99)

## 2017-02-09 LAB — HEMOGLOBIN A1C
HEMOGLOBIN A1C: 5.8 % — AB (ref 4.8–5.6)
Mean Plasma Glucose: 120 mg/dL

## 2017-02-09 LAB — PHOSPHORUS: Phosphorus: 4.7 mg/dL — ABNORMAL HIGH (ref 2.5–4.6)

## 2017-02-09 LAB — CBC
HCT: 33.1 % — ABNORMAL LOW (ref 39.0–52.0)
Hemoglobin: 10.2 g/dL — ABNORMAL LOW (ref 13.0–17.0)
MCH: 24.7 pg — AB (ref 26.0–34.0)
MCHC: 30.8 g/dL (ref 30.0–36.0)
MCV: 80.1 fL (ref 78.0–100.0)
PLATELETS: 320 10*3/uL (ref 150–400)
RBC: 4.13 MIL/uL — ABNORMAL LOW (ref 4.22–5.81)
RDW: 15.3 % (ref 11.5–15.5)
WBC: 11 10*3/uL — ABNORMAL HIGH (ref 4.0–10.5)

## 2017-02-09 LAB — TSH: TSH: 2.389 u[IU]/mL (ref 0.350–4.500)

## 2017-02-09 MED ORDER — METOPROLOL TARTRATE 25 MG PO TABS
25.0000 mg | ORAL_TABLET | Freq: Two times a day (BID) | ORAL | Status: DC
  Administered 2017-02-09 – 2017-02-10 (×3): 25 mg via ORAL
  Filled 2017-02-09 (×4): qty 1

## 2017-02-09 MED ORDER — HYDRALAZINE HCL 20 MG/ML IJ SOLN: 10.0000 mg | Freq: Four times a day (QID) | INTRAMUSCULAR | Status: DC | PRN

## 2017-02-09 NOTE — Progress Notes (Signed)
PROGRESS NOTE    Steven Trevino  [id number] DOB: 07-22-1936 DOA: 02/07/2017 PCP: Imagene Riches, NP   Chief Complaint  Patient presents with  . Weakness    Brief Narrative:  HPI on 02/07/2017 by Dr. Luberta Robertson is a 81 y.o. male with medical history significant of HTN, low iron, DM2, CKD stage V, COPD, some underlying dementia who presents for 2 weeks of weakness and malaise.  He and his wife note that about 2 weeks ago he became so weak that he just didn't want to get out of bed.  Since that time he has been mainly in bed and only getting up to go to the bathroom.  He has had less PO intake, decreased UOP, cough productive of a white sputum for the last 2-3 days, SOB with walking, dizziness and lightheadedness.  He has not had a BM in 10 days either, but has no abdominal pain, nausea, vomiting or diarrhea.  He denies sick contacts, headache, vision change, focal weakness, travel, chest pain, problems swallowing.  He has not been sick like this before.  He does have multiple medical problems but has not seen a doctor since April of last year and has not been taking any medications since around that time.   Assessment & Plan   Community Acquire Pneumonia -Presented with cough, chills, weakness -CXR showed Small right pleural effusion and right basilar airspace disease which may reflect atelectasis versus pneumonia. Recommended Xray in 3-4 weeks.  -Continue azithromycin and ceftriaxone -Strep pneumonia urine antigen negative -Blood and sputum cultures pending  -HIV nonreactive, Influenza neative   Dyspnea with exertion -Possibly related to pneumonia vs unknown CHF -Echocardiogram: EF 0000000, grade 1 diastolic dysfunction  Malaise -Likely multifactorial with worsening renal function, likely infection and decreased PO intake -Nodular opacity on CXR is concerning given history of smoking, will attempt to treat as pneumonia and repeat CXR -TSH 2.389  Chronic kidney  disease, stage IV-IV -Baseline creatinine around 4, upon admission 5.04 -Patient used to follow with Dr. Posey Pronto -He was supposed to have access last year, however, did not. -Nephrology consulted and appreciated.  -Patient stated he was open to HD, however, upon speaking to the nephrologist- declined HD -Continue to monitor intake and output -Monitor BMP -Will consult palliative care for [place] discussion  Constipation -He has had decreased PO intake, so could be related to no stool production -Abdominal xray: Nonobstructive bowel gas pattern with stool filled colon -Will add on miralax  Hypertension -Continue amlodipine     Diabetes mellitus, type II  with renal failure -Continue ISS and CBG monitoring  -Hemoglobin A1c 5.8  Tobacco abuse -Discussed smoking cessation -Continue Nicotine patch  Pulmonary nodule -CXR 3 cm nodular opacity in the superior segment of the right upper lobe which may reflect round pneumonia versus pulmonary nodule. Needs repeat CXR in 3-4 weeks.  Possible meningioma -Seen on CT head -MRI brain: 33 x 22 x 36 mm meningioma at the right insula. Mild vasogenic edema versus asymmetry small-vessel disease in the deformed right superior temporal lobe. -Spoke with Dr. Trenton Gammon, neurosurgery. Recommended outpatient surveillance and follow up. Unlikely cause of patient's weakness. Probably incidental finding. Did not recommend steroids.   COPD -Duonebs ordered  Anemia -previously with iron deficiency, supposed to be on PO iron -Hemoglobin currently 10.2 -Anemia panel: Iron 17, Ferritin 54 -placed on oral iron   Hypercalcemia -Drinks a gallon of milk every 2 days.  -PTH, Vit D, phosphorus, SPEP, ligh chains pending -Continue to  monitor   ?Dementia -has no formal diagnosis of dementia   DVT Prophylaxis  heparin  Code Status: Full  Family Communication: Wife at bedside  Disposition Plan: Admitted  Consultants Nephrology Neurosurgery, Dr. Trenton Gammon,  via phone Palliative care  Procedures  None  Antibiotics   Anti-infectives    Start     Dose/Rate Route Frequency Ordered Stop   02/08/17 1800  cefTRIAXone (ROCEPHIN) 1 g in dextrose 5 % 50 mL IVPB     1 g 100 mL/hr over 30 Minutes Intravenous Every 24 hours 02/07/17 2230 02/15/17 1759   02/07/17 2245  azithromycin (ZITHROMAX) tablet 500 mg     500 mg Oral Daily at bedtime 02/07/17 2230 02/14/17 2159   02/07/17 1700  cefTRIAXone (ROCEPHIN) 1 g in dextrose 5 % 50 mL IVPB     1 g 100 mL/hr over 30 Minutes Intravenous  Once 02/07/17 1647 02/07/17 1840      Subjective:   Steven Trevino seen and examined today.  Continues to have cough.  Feels breathing has improved, but not at baseline. Continues to feel weak.  Asked for alcohol.  Denies chest pain, abdominal pain, headache, dizziness.     Objective:   Vitals:   02/08/17 1412 02/08/17 2141 02/09/17 0500 02/09/17 0915  BP: (!) 175/82 (!) 160/71 (!) 170/76   Pulse: 79 76 76   Resp: 19 18 18    Temp: 98.6 F (37 C) 97.5 F (36.4 C) 97.8 F (36.6 C)   TempSrc: Oral Oral Oral   SpO2: 98% 99% 99% 97%  Weight:   87.1 kg (192 lb 0.3 oz)   Height:        Intake/Output Summary (Last 24 hours) at 02/09/17 1230 Last data filed at 02/09/17 1100  Gross per 24 hour  Intake              360 ml  Output              250 ml  Net              110 ml   Filed Weights   02/07/17 2352 02/08/17 0500 02/09/17 0500  Weight: 90.9 kg (200 lb 6.4 oz) 90.8 kg (200 lb 2.8 oz) 87.1 kg (192 lb 0.3 oz)    Exam  General: Well developed, well nourished, NAD, appears stated age  HEENT: NCAT, mucous membranes moist.   Neck: Supple, no JVD, submandibular lymph node.  Cardiovascular: S1 S2 auscultated, RRR, no murmurs   Respiratory: Diminished breath sounds anteriorly, expiratory wheezing.   Abdomen: Soft, nontender, nondistended, + bowel sounds  Extremities: warm dry without cyanosis clubbing or edema  Neuro: AAOx3, nonfocal  Skin: Without  rashes exudates or nodules- chronic skin changes  Psych: Appropriate   Data Reviewed: I have personally reviewed following labs and imaging studies  CBC:  Recent Labs Lab 02/07/17 1533 02/08/17 0102 02/09/17 0528  WBC 10.5 11.8* 11.0*  HGB 10.9* 11.1* 10.2*  HCT 34.5* 34.8* 33.1*  MCV 78.9 78.9 80.1  PLT 314 321 [id number]   Basic Metabolic Panel:  Recent Labs Lab 02/07/17 1533 02/08/17 0102 02/09/17 0528  NA 137 138 139  K 4.0 3.9 4.1  CL 103 103 103  CO2 25 24 28   GLUCOSE 126* 134* 109*  BUN 37* 38* 39*  CREATININE 5.15* 4.98* 5.04*  CALCIUM 11.8* 11.9* 11.8*  MG 2.2  --   --    GFR: Estimated Creatinine Clearance: 13.2 mL/min (by C-G formula based on SCr of 5.04  mg/dL (H)). Liver Function Tests: No results for input(s): AST, ALT, ALKPHOS, BILITOT, PROT, ALBUMIN in the last 168 hours. No results for input(s): LIPASE, AMYLASE in the last 168 hours. No results for input(s): AMMONIA in the last 168 hours. Coagulation Profile: No results for input(s): INR, PROTIME in the last 168 hours. Cardiac Enzymes: No results for input(s): CKTOTAL, CKMB, CKMBINDEX, TROPONINI in the last 168 hours. BNP (last 3 results) No results for input(s): PROBNP in the last 8760 hours. HbA1C:  Recent Labs  02/08/17 0102  HGBA1C 5.8*   CBG:  Recent Labs Lab 02/08/17 1127 02/08/17 1648 02/08/17 2121 02/09/17 0750 02/09/17 1146  GLUCAP 131* 127* 141* 103* 106*   Lipid Profile: No results for input(s): CHOL, HDL, LDLCALC, TRIG, CHOLHDL, LDLDIRECT in the last 72 hours. Thyroid Function Tests:  Recent Labs  02/09/17 0528  TSH 2.389   Anemia Panel:  Recent Labs  02/08/17 0102  FERRITIN 54  TIBC 237*  IRON 17*   Urine analysis:    Component Value Date/Time   COLORURINE YELLOW 02/07/2017 1742   APPEARANCEUR CLEAR 02/07/2017 1742   LABSPEC 1.014 02/07/2017 1742   PHURINE 5.0 02/07/2017 1742   GLUCOSEU 150 (A) 02/07/2017 1742   HGBUR NEGATIVE 02/07/2017 1742    BILIRUBINUR NEGATIVE 02/07/2017 1742   KETONESUR NEGATIVE 02/07/2017 1742   PROTEINUR >=300 (A) 02/07/2017 1742   UROBILINOGEN 0.2 06/19/2010 1039   NITRITE NEGATIVE 02/07/2017 1742   LEUKOCYTESUR NEGATIVE 02/07/2017 1742   Sepsis Labs: @LABRCNTIP (procalcitonin:4,lacticidven:4)  )No results found for this or any previous visit (from the past 240 hour(s)).    Radiology Studies: Dg Chest 2 View  Result Date: 02/07/2017 CLINICAL DATA:  Weakness started this morning with productive cough. EXAM: CHEST  2 VIEW COMPARISON:  10/29/2015 FINDINGS: There is a small right pleural effusion. There is right basilar airspace disease. The left lung is clear. There is no pneumothorax. 3 cm nodular opacity in the superior segment of the right upper lobe. There is no pleural effusion or pneumothorax. The heart and mediastinal contours are unremarkable. The osseous structures are unremarkable. IMPRESSION: 1. Small right pleural effusion and right basilar airspace disease which may reflect atelectasis versus pneumonia. Followup PA and lateral chest X-ray is recommended in 3-4 weeks following trial of antibiotic therapy to ensure resolution and exclude underlying malignancy. 2. 3 cm nodular opacity in the superior segment of the right upper lobe which may reflect round pneumonia versus pulmonary nodule. Followup PA and lateral chest X-ray is recommended in 3-4 weeks following trial of antibiotic therapy to ensure resolution and exclude underlying malignancy. Electronically Signed   By: Kathreen Devoid   On: 02/07/2017 15:52   Abd 1 View (kub)  Result Date: 02/07/2017 CLINICAL DATA:  Constipation for 2 weeks. Abdominal pain and bloating. Weakness. History of diabetes and dementia. EXAM: ABDOMEN - 1 VIEW COMPARISON:  None. FINDINGS: Scattered gas and stool throughout the colon. No small or large bowel distention. Calcifications in the left upper quadrant likely represent splenic granulomas. Calcification projected over the  right kidney may represent a small renal stone. Vascular calcifications. Degenerative changes in the spine and hips. Pleural thickening or effusion on the right with atelectasis or scarring in the right lung base. IMPRESSION: Nonobstructive bowel gas pattern with stool-filled colon. Atelectasis or scarring in the right lung base with fluid or thickened pleura in the right costophrenic angle. Electronically Signed   By: Lucienne Capers M.D.   On: 02/07/2017 23:02   Ct Head Wo Contrast  Result Date: 02/07/2017 CLINICAL DATA:  Weakness EXAM: CT HEAD WITHOUT CONTRAST TECHNIQUE: Contiguous axial images were obtained from the base of the skull through the vertex without intravenous contrast. COMPARISON:  02/12/2012 FINDINGS: Brain: Mild atrophic changes are noted. Chronic white matter ischemic changes are seen and stable. There a is a 2.1 x 3.5 x 3.8 cm partially calcified lesion arising in the region of the sylvian fissure on the right. It appears to be related to the right sphenoid wing. Vascular: No hyperdense vessel or unexpected calcification. Skull: Normal. Negative for fracture or focal lesion. Sinuses/Orbits: No acute finding. Other: None. IMPRESSION: Mild atrophic and ischemic changes. Partially calcified mass lesion arising in the region of the sylvian fissure on the right. This has the appearance of a meningioma and was not well visualized on the prior exam. Nonemergent MRI would be helpful for confirmation. Electronically Signed   By: Inez Catalina M.D.   On: 02/07/2017 17:46   Mr Brain Wo Contrast  Result Date: 02/08/2017 CLINICAL DATA:  Weakness.  Unable to ambulate.  Meningioma. EXAM: MRI HEAD WITHOUT CONTRAST TECHNIQUE: Multiplanar, multiecho pulse sequences of the brain and surrounding structures were obtained without intravenous contrast. COMPARISON:  Yesterday FINDINGS: Brain: The partially calcified mass along the right insula is identified is an isointense dural-based lesion measuring 33 x 22  x 36 mm. There is mild to moderate mass effect on the superior temporal lobe and frontal operculum. Asymmetric edematous signal within the white matter of the superior temporal lobe without convincing gyrus swelling. Findings are most consistent with meningioma. Atrophy with ventriculomegaly. Chronic small-vessel disease with confluent ischemic gliosis in the cerebral white matter. Single focus of chronic blood products in the posterior left cerebral hemisphere. Vascular: Asymmetric loss of flow void in the right transverse sigmoid sinus and upper internal jugular vein shows a more normalized appearance on coronal T2 weighted acquisition. No acute thrombosis suspected. Skull and upper cervical spine: No marrow lesion noted. Sinuses/Orbits: Right cataract resection.  No acute finding. Other: Motion degraded study. IMPRESSION: 1. 33 x 22 x 36 mm meningioma at the right insula. Mild vasogenic edema versus asymmetry small-vessel disease in the deformed right superior temporal lobe. 2. Prominent atrophy and chronic microvascular disease. Electronically Signed   By: Monte Fantasia M.D.   On: 02/08/2017 08:40     Scheduled Meds: . azithromycin  500 mg Oral QHS  . cefTRIAXone (ROCEPHIN)  IV  1 g Intravenous Q24H  . docusate sodium  100 mg Oral BID  . ferrous sulfate  325 mg Oral BID WC  . heparin  5,000 Units Subcutaneous Q8H  . Influenza vac split quadrivalent PF  0.5 mL Intramuscular Tomorrow-1000  . insulin aspart  0-5 Units Subcutaneous QHS  . insulin aspart  0-9 Units Subcutaneous TID WC  . ipratropium-albuterol  3 mL Nebulization TID  . nicotine  14 mg Transdermal Daily  . pneumococcal 23 valent vaccine  0.5 mL Intramuscular Tomorrow-1000  . polyethylene glycol  17 g Oral Daily   Continuous Infusions:   LOS: 2 days   Time Spent in minutes   30 minutes  Malaia Buchta D.O. on 02/09/2017 at 12:30 PM  Between 7am to 7pm - Pager - [phone]  After 7pm go to [email or web address] - password  [email or web address]  And look for the night coverage person covering for me after hours  [facility] Office  [phone]

## 2017-02-09 NOTE — Progress Notes (Signed)
[facility] Progress Note    Assessment/ Plan:   1.  CKD G V A3; appears to be at baseline creatinine (was   Discussions have been had about dialysis; pt apparently was ambivalent.  He had a fistula surgery scheduled 3x and it was cancelled each time.  Continue to hold Lasix.  Dtr in room today- both reiterated no dialysis which was his original decision; however with primary MD and wife/ dtr in the room, said that he would want to do dialysis.  Regardless, no indication to start currently.  Pall care c/s would be helpful.  2.  Hypercalcemia: mild, given MS clearing asymptomatic.  Drinks 1 gallon of mild every 2 days (? Milk-alkali syndrome).  PTH and Vit D pending, have added on phosphorous, SPEP and serum free light chains ordered.  TSH normal.   3  CAP: blood cultures pending, on azithro/ CTX  4.  Possible meningioma: noted on MRI, outpatient f/u  5 Weakness: likely related to #2, improving  6.  BMMD: PTH and Vit D pending as above- no calcium or vit d supplements  7.  Anemia: had allergic rxn to feraheme, ferrous sulfate BID; would avoid IV iron products  8.  Access: none  9  DM II per primary  10  COPD; per primary, does not appear to have an acute exac at this time  Subjective:    Feels much better, sitting up in bed.   Objective:   BP (!) 170/76 (BP Location: Right Arm)   Pulse 76   Temp 97.8 F (36.6 C) (Oral)   Resp 18   Ht 5\' 9"  (1.753 m)   Wt 87.1 kg (192 lb 0.3 oz)   SpO2 97%   BMI 28.36 kg/m   Intake/Output Summary (Last 24 hours) at 02/09/17 1225 Last data filed at 02/09/17 1100  Gross per 24 hour  Intake              360 ml  Output              250 ml  Net              110 ml   Weight change: -3.8 kg (-8 lb 6 oz)  Physical Exam: GEN: elderly man, NAD, sleeping but easily arousable HEENT EOMI, PERRL, dry MM ; edentulous NECK no JVD PULM mild R sided expiratory rhonchi, otherwise clear CV RRR, soft systolic murmur, no  r/g ABD obese, nontender, nondistended, NABS EXT no LE edema NEURO answers questions appropriately, no asterixis SKIN AK and suspicious-looking skin lesions bilateral forearms; sun-damaged skin  Imaging: Dg Chest 2 View  Result Date: 02/07/2017 CLINICAL DATA:  Weakness started this morning with productive cough. EXAM: CHEST  2 VIEW COMPARISON:  10/29/2015 FINDINGS: There is a small right pleural effusion. There is right basilar airspace disease. The left lung is clear. There is no pneumothorax. 3 cm nodular opacity in the superior segment of the right upper lobe. There is no pleural effusion or pneumothorax. The heart and mediastinal contours are unremarkable. The osseous structures are unremarkable. IMPRESSION: 1. Small right pleural effusion and right basilar airspace disease which may reflect atelectasis versus pneumonia. Followup PA and lateral chest X-ray is recommended in 3-4 weeks following trial of antibiotic therapy to ensure resolution and exclude underlying malignancy. 2. 3 cm nodular opacity in the superior segment of the right upper lobe which may reflect round pneumonia versus pulmonary nodule. Followup PA and lateral chest X-ray is recommended in 3-4 weeks  following trial of antibiotic therapy to ensure resolution and exclude underlying malignancy. Electronically Signed   By: Kathreen Devoid   On: 02/07/2017 15:52   Abd 1 View (kub)  Result Date: 02/07/2017 CLINICAL DATA:  Constipation for 2 weeks. Abdominal pain and bloating. Weakness. History of diabetes and dementia. EXAM: ABDOMEN - 1 VIEW COMPARISON:  None. FINDINGS: Scattered gas and stool throughout the colon. No small or large bowel distention. Calcifications in the left upper quadrant likely represent splenic granulomas. Calcification projected over the right kidney may represent a small renal stone. Vascular calcifications. Degenerative changes in the spine and hips. Pleural thickening or effusion on the right with atelectasis or  scarring in the right lung base. IMPRESSION: Nonobstructive bowel gas pattern with stool-filled colon. Atelectasis or scarring in the right lung base with fluid or thickened pleura in the right costophrenic angle. Electronically Signed   By: Lucienne Capers M.D.   On: 02/07/2017 23:02   Ct Head Wo Contrast  Result Date: 02/07/2017 CLINICAL DATA:  Weakness EXAM: CT HEAD WITHOUT CONTRAST TECHNIQUE: Contiguous axial images were obtained from the base of the skull through the vertex without intravenous contrast. COMPARISON:  02/12/2012 FINDINGS: Brain: Mild atrophic changes are noted. Chronic white matter ischemic changes are seen and stable. There a is a 2.1 x 3.5 x 3.8 cm partially calcified lesion arising in the region of the sylvian fissure on the right. It appears to be related to the right sphenoid wing. Vascular: No hyperdense vessel or unexpected calcification. Skull: Normal. Negative for fracture or focal lesion. Sinuses/Orbits: No acute finding. Other: None. IMPRESSION: Mild atrophic and ischemic changes. Partially calcified mass lesion arising in the region of the sylvian fissure on the right. This has the appearance of a meningioma and was not well visualized on the prior exam. Nonemergent MRI would be helpful for confirmation. Electronically Signed   By: Inez Catalina M.D.   On: 02/07/2017 17:46   Mr Brain Wo Contrast  Result Date: 02/08/2017 CLINICAL DATA:  Weakness.  Unable to ambulate.  Meningioma. EXAM: MRI HEAD WITHOUT CONTRAST TECHNIQUE: Multiplanar, multiecho pulse sequences of the brain and surrounding structures were obtained without intravenous contrast. COMPARISON:  Yesterday FINDINGS: Brain: The partially calcified mass along the right insula is identified is an isointense dural-based lesion measuring 33 x 22 x 36 mm. There is mild to moderate mass effect on the superior temporal lobe and frontal operculum. Asymmetric edematous signal within the white matter of the superior temporal lobe  without convincing gyrus swelling. Findings are most consistent with meningioma. Atrophy with ventriculomegaly. Chronic small-vessel disease with confluent ischemic gliosis in the cerebral white matter. Single focus of chronic blood products in the posterior left cerebral hemisphere. Vascular: Asymmetric loss of flow void in the right transverse sigmoid sinus and upper internal jugular vein shows a more normalized appearance on coronal T2 weighted acquisition. No acute thrombosis suspected. Skull and upper cervical spine: No marrow lesion noted. Sinuses/Orbits: Right cataract resection.  No acute finding. Other: Motion degraded study. IMPRESSION: 1. 33 x 22 x 36 mm meningioma at the right insula. Mild vasogenic edema versus asymmetry small-vessel disease in the deformed right superior temporal lobe. 2. Prominent atrophy and chronic microvascular disease. Electronically Signed   By: Monte Fantasia M.D.   On: 02/08/2017 08:40    Labs: BMET  Recent Labs Lab 02/07/17 1533 02/08/17 0102 02/09/17 0528  NA 137 138 139  K 4.0 3.9 4.1  CL 103 103 103  CO2 25 24 28  GLUCOSE 126* 134* 109*  BUN 37* 38* 39*  CREATININE 5.15* 4.98* 5.04*  CALCIUM 11.8* 11.9* 11.8*   CBC  Recent Labs Lab 02/07/17 1533 02/08/17 0102 02/09/17 0528  WBC 10.5 11.8* 11.0*  HGB 10.9* 11.1* 10.2*  HCT 34.5* 34.8* 33.1*  MCV 78.9 78.9 80.1  PLT 314 321 320    Medications:    . azithromycin  500 mg Oral QHS  . cefTRIAXone (ROCEPHIN)  IV  1 g Intravenous Q24H  . docusate sodium  100 mg Oral BID  . ferrous sulfate  325 mg Oral BID WC  . heparin  5,000 Units Subcutaneous Q8H  . Influenza vac split quadrivalent PF  0.5 mL Intramuscular Tomorrow-1000  . insulin aspart  0-5 Units Subcutaneous QHS  . insulin aspart  0-9 Units Subcutaneous TID WC  . ipratropium-albuterol  3 mL Nebulization TID  . nicotine  14 mg Transdermal Daily  . pneumococcal 23 valent vaccine  0.5 mL Intramuscular Tomorrow-1000  . polyethylene  glycol  17 g Oral Daily      Madelon Lips MD [facility] pgr [phone] 02/09/2017, 12:25 PM

## 2017-02-09 NOTE — Care Management Note (Signed)
Case Management Note  Patient Details  Name: Steven Trevino MRN: [id number] Date of Birth: [birth year]  Subjective/Objective:   81 y/o m admitted w/CAP. From home. PT recc SNF. CSW already following.                 Action/Plan:d/c SNF.   Expected Discharge Date:                  Expected Discharge Plan:  Skilled Nursing Facility  In-House Referral:  Clinical Social Work  Discharge planning Services  CM Consult  Post Acute Care Choice:    Choice offered to:     DME Arranged:    DME Agency:     HH Arranged:    [place] Agency:     Status of Service:  In process, will continue to follow  If discussed at Long Length of Stay Meetings, dates discussed:    Additional Comments:  Dessa Phi, RN 02/09/2017, 1:04 PM

## 2017-02-10 ENCOUNTER — Inpatient Hospital Stay (HOSPITAL_COMMUNITY): Payer: Medicare HMO

## 2017-02-10 DIAGNOSIS — N184 Chronic kidney disease, stage 4 (severe): Secondary | ICD-10-CM

## 2017-02-10 DIAGNOSIS — Z7189 Other specified counseling: Secondary | ICD-10-CM

## 2017-02-10 DIAGNOSIS — E1122 Type 2 diabetes mellitus with diabetic chronic kidney disease: Secondary | ICD-10-CM

## 2017-02-10 DIAGNOSIS — J181 Lobar pneumonia, unspecified organism: Secondary | ICD-10-CM

## 2017-02-10 DIAGNOSIS — N185 Chronic kidney disease, stage 5: Secondary | ICD-10-CM

## 2017-02-10 DIAGNOSIS — Z515 Encounter for palliative care: Secondary | ICD-10-CM

## 2017-02-10 DIAGNOSIS — I10 Essential (primary) hypertension: Secondary | ICD-10-CM

## 2017-02-10 DIAGNOSIS — E119 Type 2 diabetes mellitus without complications: Secondary | ICD-10-CM

## 2017-02-10 LAB — VITAMIN D 25 HYDROXY (VIT D DEFICIENCY, FRACTURES): VIT D 25 HYDROXY: 31.8 ng/mL (ref 30.0–100.0)

## 2017-02-10 LAB — BASIC METABOLIC PANEL
ANION GAP: 7 (ref 5–15)
BUN: 39 mg/dL — ABNORMAL HIGH (ref 6–20)
CALCIUM: 12.1 mg/dL — AB (ref 8.9–10.3)
CO2: 27 mmol/L (ref 22–32)
Chloride: 104 mmol/L (ref 101–111)
Creatinine, Ser: 5.28 mg/dL — ABNORMAL HIGH (ref 0.61–1.24)
GFR, EST AFRICAN AMERICAN: 11 mL/min — AB (ref >60)
GFR, EST NON AFRICAN AMERICAN: 9 mL/min — AB (ref >60)
Glucose, Bld: 116 mg/dL — ABNORMAL HIGH (ref 65–99)
POTASSIUM: 4.3 mmol/L (ref 3.5–5.1)
Sodium: 138 mmol/L (ref 135–145)

## 2017-02-10 LAB — CBC
HEMATOCRIT: 33 % — AB (ref 39.0–52.0)
Hemoglobin: 10.2 g/dL — ABNORMAL LOW (ref 13.0–17.0)
MCH: 24.8 pg — ABNORMAL LOW (ref 26.0–34.0)
MCHC: 30.9 g/dL (ref 30.0–36.0)
MCV: 80.3 fL (ref 78.0–100.0)
PLATELETS: 285 10*3/uL (ref 150–400)
RBC: 4.11 MIL/uL — AB (ref 4.22–5.81)
RDW: 15.3 % (ref 11.5–15.5)
WBC: 9.3 10*3/uL (ref 4.0–10.5)

## 2017-02-10 LAB — PARATHYROID HORMONE, INTACT (NO CA): PTH: 10 pg/mL — ABNORMAL LOW (ref 15–65)

## 2017-02-10 LAB — KAPPA/LAMBDA LIGHT CHAINS
KAPPA, LAMDA LIGHT CHAIN RATIO: 1.24 (ref 0.26–1.65)
Kappa free light chain: 99.1 mg/L — ABNORMAL HIGH (ref 3.3–19.4)
Lambda free light chains: 79.6 mg/L — ABNORMAL HIGH (ref 5.7–26.3)

## 2017-02-10 LAB — GLUCOSE, CAPILLARY
GLUCOSE-CAPILLARY: 115 mg/dL — AB (ref 65–99)
GLUCOSE-CAPILLARY: 122 mg/dL — AB (ref 65–99)
Glucose-Capillary: 105 mg/dL — ABNORMAL HIGH (ref 65–99)
Glucose-Capillary: 135 mg/dL — ABNORMAL HIGH (ref 65–99)

## 2017-02-10 LAB — HEMOGLOBIN A1C
HEMOGLOBIN A1C: 5.6 % (ref 4.8–5.6)
Mean Plasma Glucose: 114 mg/dL

## 2017-02-10 MED ORDER — SORBITOL 70 % SOLN
60.0000 mL | Freq: Once | Status: DC
  Filled 2017-02-10: qty 60

## 2017-02-10 MED ORDER — BISACODYL 10 MG RE SUPP
10.0000 mg | Freq: Once | RECTAL | Status: AC
  Administered 2017-02-10: 10 mg via RECTAL
  Filled 2017-02-10: qty 1

## 2017-02-10 NOTE — Progress Notes (Addendum)
Attempted to give patient oral Sorbitol for constipation per MD orders. Patient took one sip and began to overtly cough, gag, and c/o nausea. It is suspected that patient aspirated. He coughed for about 15 minutes and spit up a moderate amount of thick clear sputum. Suction set up at bedside and patient and his wife were educated on how to use the yaunker. MD made aware that patient could not tolerate and order received for Dulcolax Suppository. Paged Speech Therapy, as they cancelled treatment today, to see if they could come back to evaluate patient. Will continue to monitor patient.   Steven Trevino [facility] 02/10/2017

## 2017-02-10 NOTE — Progress Notes (Signed)
[facility] Progress Note    Assessment/ Plan:   1.  CKD G V A3; appears to be at baseline creatinine.  No HD . Greatly appreciate palliative care.  Home with hospice.  In this setting, we will sign off.  Please let us know if there are problems or questions.  2.  Hypercalcemia: mild, given MS clearing asymptomatic.  Drinks 1 gallon of mild every 2 days.  PTH low, Vit D normal, SPEP/ serum free light chains pending.  Given smoking history and h/o oncocytoma would consider hypercalcemia of malignancy; aggressive diagnostic workup/ treatment does not appear to be in line with goals of care.  If this were to change, would recommend US renal and a CT chest without contrast.  3  CAP: blood cultures neg so far, on azithro/ CTX  4.  Possible meningioma: noted on MRI, outpatient f/u  5 Weakness: likely related to #2, improving  6.  BMMD: PTH and Vit D pending as above- no calcium or vit d supplements  7.  Anemia: had allergic rxn to feraheme, ferrous sulfate BID; would avoid IV iron products  8.  Access: none  9  DM II per primary  10  COPD; per primary, does not appear to have an acute exac at this time  Subjective:    Had a meeting with palliative care this AM .  He confirmed that he does not want dialysis at this time.  His biggest complaint is not being able to smoke a cigarette.   Objective:   BP (!) 175/74 (BP Location: Left Arm)   Pulse 72   Temp 97.9 F (36.6 C) (Oral)   Resp 18   Ht 5\' 9"  (1.753 m)   Wt 87 kg (191 lb 12.8 oz)   SpO2 97%   BMI 28.32 kg/m   Intake/Output Summary (Last 24 hours) at 02/10/17 1334 Last data filed at 02/09/17 1733  Gross per 24 hour  Intake              100 ml  Output                0 ml  Net              100 ml   Weight change: -0.1 kg (-3.5 oz)  Physical Exam: GEN: elderly man, NAD, sleeping but easily arousable HEENT EOMI, PERRL, dry MM ; edentulous NECK no JVD PULM mild R sided expiratory rhonchi, otherwise  clear CV RRR, soft systolic murmur, no r/g ABD obese, nontender, nondistended, NABS EXT no LE edema NEURO answers questions appropriately, no asterixis SKIN AK and suspicious-looking skin lesions bilateral forearms; sun-damaged skin  Imaging: Dg Chest Port 1 View  Result Date: 02/10/2017 CLINICAL DATA:  Hypertension.  COPD. EXAM: PORTABLE CHEST 1 VIEW COMPARISON:  02/07/2017.  10/29/2015. FINDINGS: Worsening right lower lobe infiltrate noted. Right -sided pleural effusion noted without significant change. Nodular density is noted in the right upper lobe. This is not changed from prior exam. A pulmonary mass lesion could present this fashion. No pneumothorax. Cardiomegaly with stable pulmonary vascularity. No acute bony abnormality. IMPRESSION: 1. Worsening infiltrate right lower lobe. Right pleural effusion again noted without significant change. 2. Persistent nodular density in the right upper lobe. Right upper lobe mass lesion cannot be excluded. Contrast-enhanced chest CT may prove useful for further evaluation of this patient. Electronically Signed   By: Marcello Moores  Register   On: 02/10/2017 13:08    Labs: BMET  Recent Labs Lab 02/07/17  1533 02/08/17 0102 02/09/17 0528 02/10/17 0617  NA 137 138 139 138  K 4.0 3.9 4.1 4.3  CL 103 103 103 104  CO2 25 24 28 27   GLUCOSE 126* 134* 109* 116*  BUN 37* 38* 39* 39*  CREATININE 5.15* 4.98* 5.04* 5.28*  CALCIUM 11.8* 11.9* 11.8* 12.1*  PHOS  --   --  4.7*  --    CBC  Recent Labs Lab 02/07/17 1533 02/08/17 0102 02/09/17 0528 02/10/17 0617  WBC 10.5 11.8* 11.0* 9.3  HGB 10.9* 11.1* 10.2* 10.2*  HCT 34.5* 34.8* 33.1* 33.0*  MCV 78.9 78.9 80.1 80.3  PLT 314 321 320 285    Medications:    . azithromycin  500 mg Oral QHS  . cefTRIAXone (ROCEPHIN)  IV  1 g Intravenous Q24H  . docusate sodium  100 mg Oral BID  . ferrous sulfate  325 mg Oral BID WC  . heparin  5,000 Units Subcutaneous Q8H  . insulin aspart  0-5 Units Subcutaneous  QHS  . insulin aspart  0-9 Units Subcutaneous TID WC  . ipratropium-albuterol  3 mL Nebulization TID  . metoprolol tartrate  25 mg Oral BID  . nicotine  14 mg Transdermal Daily  . polyethylene glycol  17 g Oral Daily  . sorbitol  60 mL Oral Once      Madelon Lips MD [facility] pgr [phone] 02/10/2017, 1:34 PM

## 2017-02-10 NOTE — Progress Notes (Signed)
SLP Cancellation Note  Patient Details Name: Steven Trevino MRN: [id number] DOB: 01-24-1936   Cancelled treatment:       Reason Eval/Treat Not Completed: Patient at procedure or test/unavailable. Will continue efforts.   [name] B. [name] [facility], CCC-SLP [id number]  [name] 02/10/2017, 3:48 PM

## 2017-02-10 NOTE — Care Management Note (Signed)
Case Management Note  Patient Details  Name: Steven Trevino MRN: [id number] Date of Birth: December 08, 1936  Subjective/Objective:  Spoke to spouse about home w/hospice referral-spouse chose  [facility]-rep Amy will assess if can accept. [id number] [place]. DME-w/c. Lives in a mobile home-limited w/space. Will transport home by ambulance transp-[facility].                   Action/Plan:d/c home w/hospice.   Expected Discharge Date:                  Expected Discharge Plan:  Home w Hospice Care  In-House Referral:  Clinical Social Work  Discharge planning Services  CM Consult  Post Acute Care Choice:    Choice offered to:  Spouse  DME Arranged:    DME Agency:     HH Arranged:    [place] Agency:     Status of Service:  In process, will continue to follow  If discussed at Long Length of Stay Meetings, dates discussed:    Additional Comments:  Dessa Phi, RN 02/10/2017, 3:11 PM

## 2017-02-10 NOTE — Consult Note (Signed)
Consultation Note Date: 02/10/2017   Patient Name: Steven Trevino  DOB: 03-05-1936  MRN: [id number]  Age / Sex: 81 y.o., male  PCP: Imagene Riches, NP Referring Physician: Verlee Monte, MD  Reason for Consultation: Establishing goals of care with regard to chronic kidney disease stage IV-V.  HPI/Patient Profile: 81 y.o. male  with past medical history of chronic kidney disease stage IV-V, COPD, DM, sleep apnea who was admitted on 02/07/2017 with weakness.  Steven Trevino and his wife noticed weakness for about 2 weeks prior to this admission, which resulted in being mainly confined to bed.  This is a new complaint as he denies prior symptoms of this nature.  He had a CXR which showed a right sided pleural effusion with possible pneumonia and a right upper lobe pulmonary nodule.  Cr was up to 5.15 from a baseline around 4, BUN of 37.  UA did not show any signs of infection. Nephrology consulted, in process of determining whether to pursue hemodialysis.  MRI head showed meningioma of approximately 3cm with mass affect; neurosurgery to follow as outpatient.   Clinical Assessment and Goals of Care: We arrive to find Steven Trevino sitting up in a chair, his wife, Baldo Ash, standing beside him.  He appears lethargic, but is is alert and oriented to our conversation.  He tells [place] a little about himself; that he grew up in [place], and now lives in [place] in [place].  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  He and Baldo Ash had six children together, three of whom are still living and local.  He does believe in God, but ascribes to no specific church or religion. Steven Trevino understands that he is in the hospital to treat pneumonia, and that he has serious kidney disease.  Baldo Ash recounted his deliberation and ultimate refusal of dialysis shunt placement last year.  He  is at first uncertain about pursuing dialysis at this point, but after discussing the ramifications of both receiving and refusing HD, he indicated that he ultimately values quality of life over quantity and that HD does not align with that goal.  His main priority is to be at home. In that vein, we talked about at-home hospice services that would be appropriate given his priorities.  He would indeed like hospice care. We also discussed end-of-life preferences, and established that Steven Trevino clearly does not want CPR, intubation, or other artificial life support measures.  We reiterated what DNR means and he again stated that he does not want resuscitation should it be indicated.  His status was updated to reflect his wishes.   Primary Decision Maker:  NEXT OF KIN wife, Baldo Ash    SUMMARY OF RECOMMENDATIONS    Home with Hospice Services.  Code Status/Advance Care Planning:  DNR  Symptom Management:   Oral secretions: notably audible but did not seem to bother patient - would consider anticholinergic if patient desires  Continue other sx management per primary care  team  Additional Recommendations (Limitations, Scope, Preferences):  Patient essentially comfort care; does not want resuscitation, intubation, hemodialysis, or other aggressive treatment measures  Psycho-social/Spiritual:   Desire for further Chaplaincy support: none at this time  Prognosis:   < 6 months due to CKD stage IV-V, plus COPD, DM, meningioma  Discharge Planning: Home with Hospice      Primary Diagnoses: Present on Admission: . CAP (community acquired pneumonia) . Stage 4 chronic renal impairment associated with type 2 diabetes mellitus ([place]) . Essential hypertension . Chronic kidney disease (CKD)   I have reviewed the medical record, interviewed the patient and family, and examined the patient. The following aspects are pertinent.  Past Medical History:  Diagnosis Date  . Anemia    low iron   . Cancer ([place])    skin cancer  . Chronic kidney disease (CKD)    Stage 4  . COPD (chronic obstructive pulmonary disease) ([place])   . Diabetes mellitus without complication ([place])    type 2  . History of hiatal hernia    hx of   . Hypertension   . Iron deficiency   . Pneumonia   . Renal disorder    One Kidney  . Shortness of breath dyspnea    with exertion  . Sleep apnea    does not use cpap   Social History   Social History  . Marital status: Married    Spouse name: N/A  . Number of children: N/A  . Years of education: N/A   Social History Main Topics  . Smoking status: Current Every Day Smoker    Packs/day: 2.00    Years: 60.00    Types: Cigarettes  . Smokeless tobacco: Former Systems developer  . Alcohol use No  . Drug use: No  . Sexual activity: Not Asked   Other Topics Concern  . None   Social History Narrative  . None   Family History  Problem Relation Age of Onset  . Diabetes Mellitus II Mother    Scheduled Meds: . azithromycin  500 mg Oral QHS  . cefTRIAXone (ROCEPHIN)  IV  1 g Intravenous Q24H  . docusate sodium  100 mg Oral BID  . ferrous sulfate  325 mg Oral BID WC  . heparin  5,000 Units Subcutaneous Q8H  . insulin aspart  0-5 Units Subcutaneous QHS  . insulin aspart  0-9 Units Subcutaneous TID WC  . ipratropium-albuterol  3 mL Nebulization TID  . metoprolol tartrate  25 mg Oral BID  . nicotine  14 mg Transdermal Daily  . polyethylene glycol  17 g Oral Daily   Continuous Infusions: PRN Meds:.acetaminophen **OR** acetaminophen, bisacodyl, hydrALAZINE, ondansetron **OR** ondansetron (ZOFRAN) IV Allergies  Allergen Reactions  . Feraheme [Ferumoxytol] Nausea And Vomiting and Other (See Comments)    Chest pain, back pain, diaphoresis   Review of Systems  Constitutional: Positive for activity change and fatigue.  Gastrointestinal: Positive for constipation.   Physical Exam  Constitutional: He is oriented to person, place, and time. He appears  well-developed and well-nourished.  HENT:  Head: Normocephalic and atraumatic.  Neurological: He is alert and oriented to person, place, and time.  Skin: Skin is warm and dry. He is not diaphoretic.    Elderly gentleman who appears lethargic, weak Significant dysarthria, difficult to understand speech Subdued demeanor but appropriate affect  Vital Signs: BP (!) 175/74 (BP Location: Left Arm)   Pulse 72   Temp 97.9 F (36.6 C) (Oral)   Resp 18  Ht 5\' 9"  (1.753 m)   Wt 87 kg (191 lb 12.8 oz)   SpO2 97%   BMI 28.32 kg/m  Pain Assessment: No/denies pain     SpO2: SpO2: 97 % O2 Device:SpO2: 97 % O2 Flow Rate: .   IO: Intake/output summary:  Intake/Output Summary (Last 24 hours) at 02/10/17 1215 Last data filed at 02/09/17 1733  Gross per 24 hour  Intake              200 ml  Output                0 ml  Net              200 ml    LBM: Last BM Date: 01/25/17 Baseline Weight: Weight: 90.9 kg (200 lb 6.4 oz) Most recent weight: Weight: 87 kg (191 lb 12.8 oz)     Palliative Assessment/Data:   Flowsheet Rows   Flowsheet Row Most Recent Value  Intake Tab  Referral Department  Hospitalist  Unit at Time of Referral  Cardiac/Telemetry Unit  Palliative Care Primary Diagnosis  Nephrology  Date Notified  02/09/17  Palliative Care Type  New Palliative care  Reason for referral  Clarify Goals of Care  Date of Admission  02/07/17  # of days IP prior to Palliative referral  2  Clinical Assessment  Psychosocial & Spiritual Assessment  Palliative Care Outcomes      Time In: 12:00 Time Out: 1:10 Time Total: 70 minutes Greater than 50%  of this time was spent counseling and coordinating care related to the above assessment and plan.  Signed by: Olene Floss, PA-S Imogene Burn, PA-C Palliative Medicine Pager: [phone]   Please contact Palliative Medicine Team phone at [phone] for questions and concerns.  For individual provider: See  Shea Evans

## 2017-02-10 NOTE — Progress Notes (Signed)
PROGRESS NOTE    Steven Trevino  [id number] DOB: 02-06-1936 DOA: 02/07/2017 PCP: Imagene Riches, NP   Chief Complaint  Patient presents with  . Weakness    Subjective:   Seen with his wife at bedside, he goes by Steven Trevino, reported mild shortness of breath. A denies any other complaints, await evaluation from palliative care..    Brief Narrative:  HPI on 02/07/2017 by Dr. Luberta Robertson is a 81 y.o. male with medical history significant of HTN, low iron, DM2, CKD stage V, COPD, some underlying dementia who presents for 2 weeks of weakness and malaise.  He and his wife note that about 2 weeks ago he became so weak that he just didn't want to get out of bed.  Since that time he has been mainly in bed and only getting up to go to the bathroom.  He has had less PO intake, decreased UOP, cough productive of a white sputum for the last 2-3 days, SOB with walking, dizziness and lightheadedness.  He has not had a BM in 10 days either, but has no abdominal pain, nausea, vomiting or diarrhea.  He denies sick contacts, headache, vision change, focal weakness, travel, chest pain, problems swallowing.  He has not been sick like this before.  He does have multiple medical problems but has not seen a doctor since April of last year and has not been taking any medications since around that time.    Assessment & Plan   Community Acquired Pneumonia -Presented with cough, chills, weakness -CXR showed Small right pleural effusion and right basilar airspace disease which may reflect atelectasis versus pneumonia. Recommended Xray in 3-4 weeks.  -Blood and sputum cultures NGTD, nonreactive HIV, urine negative for Streptococcus antigen. -Continue supportive management with bronchodilators, mucolytics and oxygen as needed.   Dyspnea with exertion -Possibly related to pneumonia. -Echocardiogram: EF 0000000, grade 1 diastolic dysfunction  Malaise -Likely multifactorial with worsening renal function,  likely infection and decreased PO intake -Nodular opacity on CXR is concerning given history of smoking, will attempt to treat as pneumonia and repeat CXR -TSH 2.389  Chronic kidney disease, stage IV-IV -Baseline creatinine around 4, upon admission 5.04 -Patient used to follow with Dr. Posey Pronto -He was supposed to have access last year, however, did not. -Nephrology consulted and appreciated.  -Patient stated he was open to HD, however, upon speaking to the nephrologist- declined HD -Only 200 mL of urine listed as output from yesterday, doubt this is accurate -He does not seems to be floridly overloaded, Lasix and hold, continue to hold it.  Constipation -He has had decreased PO intake, so could be related to no stool production -Abdominal xray: Nonobstructive bowel gas pattern with stool filled colon -Sorbitol  Hypertension -Continue amlodipine     Diabetes mellitus, type II  with renal failure -Continue ISS and CBG monitoring  -Hemoglobin A1c 5.8  Tobacco abuse -Discussed smoking cessation -Continue Nicotine patch  Pulmonary nodule -CXR 3 cm nodular opacity in the superior segment of the right upper lobe which may reflect round pneumonia versus pulmonary nodule. Needs repeat CXR in 3-4 weeks.  Possible meningioma -Seen on CT head -MRI brain: 33 x 22 x 36 mm meningioma at the right insula. Mild vasogenic edema versus asymmetry small-vessel disease in the deformed right superior temporal lobe. -Spoke with Dr. Trenton Gammon, neurosurgery. Recommended outpatient surveillance and follow up. Unlikely cause of patient's weakness. Probably incidental finding. Did not recommend steroids.   COPD -Duonebs ordered  Anemia -previously with  iron deficiency, supposed to be on PO iron -Hemoglobin currently 10.2 -Anemia panel: Iron 17, Ferritin 54 -placed on oral iron   Hypercalcemia -Drinks a gallon of milk every 2 days.  -PTH, Vit D, phosphorus, SPEP, ligh chains pending -Continue to  monitor   ?Dementia -has no formal diagnosis of dementia   DVT Prophylaxis  heparin  Code Status: Full Family Communication: Wife at bedside Disposition Plan: Admitted  Consultants Nephrology Neurosurgery, Dr. Trenton Gammon, via phone Palliative care  Procedures  None  Antibiotics   Anti-infectives    Start     Dose/Rate Route Frequency Ordered Stop   02/08/17 1800  cefTRIAXone (ROCEPHIN) 1 g in dextrose 5 % 50 mL IVPB     1 g 100 mL/hr over 30 Minutes Intravenous Every 24 hours 02/07/17 2230 02/15/17 1759   02/07/17 2245  azithromycin (ZITHROMAX) tablet 500 mg     500 mg Oral Daily at bedtime 02/07/17 2230 02/14/17 2159   02/07/17 1700  cefTRIAXone (ROCEPHIN) 1 g in dextrose 5 % 50 mL IVPB     1 g 100 mL/hr over 30 Minutes Intravenous  Once 02/07/17 1647 02/07/17 1840        Objective:   Vitals:   02/09/17 1300 02/09/17 2033 02/10/17 0515 02/10/17 0959  BP: (!) 172/86 (!) 157/78 (!) 158/78 (!) 175/74  Pulse: 81 86 72 72  Resp:  18 18   Temp: 97.9 F (36.6 C) 97.9 F (36.6 C) 97.9 F (36.6 C)   TempSrc: Oral Oral Oral   SpO2: 96% 97% 97%   Weight:   87 kg (191 lb 12.8 oz)   Height:        Intake/Output Summary (Last 24 hours) at 02/10/17 1225 Last data filed at 02/09/17 1733  Gross per 24 hour  Intake              200 ml  Output                0 ml  Net              200 ml   Filed Weights   02/08/17 0500 02/09/17 0500 02/10/17 0515  Weight: 90.8 kg (200 lb 2.8 oz) 87.1 kg (192 lb 0.3 oz) 87 kg (191 lb 12.8 oz)    Exam  General: Well developed, well nourished, NAD, appears stated age  HEENT: NCAT, mucous membranes moist.   Neck: Supple, no JVD, submandibular lymph node.  Cardiovascular: S1 S2 auscultated, RRR, no murmurs   Respiratory: Diminished breath sounds anteriorly, expiratory wheezing.   Abdomen: Soft, nontender, nondistended, + bowel sounds  Extremities: warm dry without cyanosis clubbing or edema  Neuro: AAOx3, nonfocal  Skin: Without  rashes exudates or nodules- chronic skin changes  Psych: Appropriate   Data Reviewed: I have personally reviewed following labs and imaging studies  CBC:  Recent Labs Lab 02/07/17 1533 02/08/17 0102 02/09/17 0528 02/10/17 0617  WBC 10.5 11.8* 11.0* 9.3  HGB 10.9* 11.1* 10.2* 10.2*  HCT 34.5* 34.8* 33.1* 33.0*  MCV 78.9 78.9 80.1 80.3  PLT 314 321 320 [id number]   Basic Metabolic Panel:  Recent Labs Lab 02/07/17 1533 02/08/17 0102 02/09/17 0528 02/10/17 0617  NA 137 138 139 138  K 4.0 3.9 4.1 4.3  CL 103 103 103 104  CO2 25 24 28 27   GLUCOSE 126* 134* 109* 116*  BUN 37* 38* 39* 39*  CREATININE 5.15* 4.98* 5.04* 5.28*  CALCIUM 11.8* 11.9* 11.8* 12.1*  MG 2.2  --   --   --  PHOS  --   --  4.7*  --    GFR: Estimated Creatinine Clearance: 12.6 mL/min (by C-G formula based on SCr of 5.28 mg/dL (H)). Liver Function Tests: No results for input(s): AST, ALT, ALKPHOS, BILITOT, PROT, ALBUMIN in the last 168 hours. No results for input(s): LIPASE, AMYLASE in the last 168 hours. No results for input(s): AMMONIA in the last 168 hours. Coagulation Profile: No results for input(s): INR, PROTIME in the last 168 hours. Cardiac Enzymes: No results for input(s): CKTOTAL, CKMB, CKMBINDEX, TROPONINI in the last 168 hours. BNP (last 3 results) No results for input(s): PROBNP in the last 8760 hours. HbA1C:  Recent Labs  02/08/17 0102 02/08/17 1329  HGBA1C 5.8* 5.6   CBG:  Recent Labs Lab 02/09/17 1146 02/09/17 1701 02/09/17 2031 02/10/17 0742 02/10/17 1145  GLUCAP 106* 119* 133* 105* 115*   Lipid Profile: No results for input(s): CHOL, HDL, LDLCALC, TRIG, CHOLHDL, LDLDIRECT in the last 72 hours. Thyroid Function Tests:  Recent Labs  02/09/17 0528  TSH 2.389   Anemia Panel:  Recent Labs  02/08/17 0102  FERRITIN 54  TIBC 237*  IRON 17*   Urine analysis:    Component Value Date/Time   COLORURINE YELLOW 02/07/2017 1742   APPEARANCEUR CLEAR 02/07/2017 1742    LABSPEC 1.014 02/07/2017 1742   PHURINE 5.0 02/07/2017 1742   GLUCOSEU 150 (A) 02/07/2017 1742   HGBUR NEGATIVE 02/07/2017 1742   BILIRUBINUR NEGATIVE 02/07/2017 1742   KETONESUR NEGATIVE 02/07/2017 1742   PROTEINUR >=300 (A) 02/07/2017 1742   UROBILINOGEN 0.2 06/19/2010 1039   NITRITE NEGATIVE 02/07/2017 1742   LEUKOCYTESUR NEGATIVE 02/07/2017 1742   Sepsis Labs: @LABRCNTIP (procalcitonin:4,lacticidven:4)  ) Recent Results (from the past 240 hour(s))  Culture, blood (routine x 2) Call MD if unable to obtain prior to antibiotics being given     Status: None (Preliminary result)   Collection Time: 02/08/17  1:15 AM  Result Value Ref Range Status   Specimen Description BLOOD LEFT ANTECUBITAL  Final   Special Requests IN PEDIATRIC BOTTLE 2ML  Final   Culture   Final    NO GROWTH 1 DAY Performed at [facility], [place] [address] [place], [place] [address]    Report Status PENDING  Incomplete  Culture, blood (routine x 2) Call MD if unable to obtain prior to antibiotics being given     Status: None (Preliminary result)   Collection Time: 02/08/17  1:15 AM  Result Value Ref Range Status   Specimen Description BLOOD RIGHT HAND  Final   Special Requests IN PEDIATRIC BOTTLE 1ML  Final   Culture   Final    NO GROWTH 1 DAY Performed at [facility], [place] [address], [place], [place] [address]    Report Status PENDING  Incomplete      Radiology Studies: No results found.   Scheduled Meds: . azithromycin  500 mg Oral QHS  . cefTRIAXone (ROCEPHIN)  IV  1 g Intravenous Q24H  . docusate sodium  100 mg Oral BID  . ferrous sulfate  325 mg Oral BID WC  . heparin  5,000 Units Subcutaneous Q8H  . insulin aspart  0-5 Units Subcutaneous QHS  . insulin aspart  0-9 Units Subcutaneous TID WC  . ipratropium-albuterol  3 mL Nebulization TID  . metoprolol tartrate  25 mg Oral BID  . nicotine  14 mg Transdermal Daily  . polyethylene glycol  17 g Oral Daily   Continuous  Infusions:   LOS:  3 days   Time Spent in minutes   30 minutes  Osmara Drummonds A, MD on 02/10/2017 at 12:25 PM  Between 7am to 7pm - Pager - [phone]  After 7pm go to [email or web address] - password [email or web address]  And look for the night coverage person covering for me after hours  [facility] Office  [phone]

## 2017-02-10 NOTE — Progress Notes (Signed)
OT Cancellation Note  Patient Details Name: Steven Trevino MRN: [id number] DOB: Aug 26, 1936   Cancelled Treatment:    Reason Eval/Treat Not Completed: Other (comment). Hospice with pt. Will check back another time.  Palyn Scrima 02/10/2017, 2:59 PM  Lesle Chris, OTR/L [phone] 02/10/2017

## 2017-02-10 NOTE — Progress Notes (Signed)
[facility] Liaison: RN visit.  Notified by Earney Hamburg, of patient/family for Hospice and [place] services at home after discharge.  Chart and patient information sent to Dr. Brigid Re, [place] Director, for review.  Hospital eligibility pending.   Writer spoke with patient and Baldo Ash, wife, at bedside to initiate education related to hospice philosophy, services and team approach to care.  Family voiced understanding of information provided.  Per discussion, plan is for discharge to home by PTAR on 02/11/17.  Please send signed and completed DNR form home with patient/family.  Patient will need prescription for discharge comfort medications.   DME needs discussed and patient currently has the following in the home:  Gilford Rile, [facility], shower chair. Family request the following DME for delivery to the home today:  Wheelchair (patient has narrow hallways).  Patient/family decline hospital bed at this time.  [facility] Chartered certified accountant, Gayland Curry, notified and will contact [place] to arrange delivery to the home.  The home address has been verified and is different from the address on chart.  Correct delivery address is [address], [place], [place] [address].    [facility] Referral Center aware of the above.  Completed d/c summary will need to be faxed to [facility] at [phone], when final.  Please notify [facility] when patient is ready to leave unit at discharge--call [phone] or [phone] after 5 pm).  [facility] information and contact numbers have been given to [place], wife, during the visit.  Above information shared with Juliann Pulse, [facility].  Please feel free to call me with any hospice related questions or concerns.  Thank you for the referral.  Edyth Gunnels, RN, [facility] Liaison [phone]  All hospital liaison's are now on [place].

## 2017-02-11 DIAGNOSIS — R451 Restlessness and agitation: Secondary | ICD-10-CM

## 2017-02-11 DIAGNOSIS — Z7189 Other specified counseling: Secondary | ICD-10-CM

## 2017-02-11 DIAGNOSIS — Z515 Encounter for palliative care: Secondary | ICD-10-CM

## 2017-02-11 LAB — PROTEIN ELECTROPHORESIS, SERUM
A/G Ratio: 0.8 (ref 0.7–1.7)
ALBUMIN ELP: 2.6 g/dL — AB (ref 2.9–4.4)
ALPHA-2-GLOBULIN: 1 g/dL (ref 0.4–1.0)
Alpha-1-Globulin: 0.3 g/dL (ref 0.0–0.4)
BETA GLOBULIN: 0.8 g/dL (ref 0.7–1.3)
GAMMA GLOBULIN: 1 g/dL (ref 0.4–1.8)
Globulin, Total: 3.1 g/dL (ref 2.2–3.9)
Total Protein ELP: 5.7 g/dL — ABNORMAL LOW (ref 6.0–8.5)

## 2017-02-11 LAB — GLUCOSE, CAPILLARY: Glucose-Capillary: 114 mg/dL — ABNORMAL HIGH (ref 65–99)

## 2017-02-11 MED ORDER — OXYCODONE-ACETAMINOPHEN 5-325 MG/5ML PO SOLN: 5.0000 mL | ORAL | 0 refills | Status: DC | PRN

## 2017-02-11 MED ORDER — LORAZEPAM 0.5 MG PO TABS: 0.5000 mg | ORAL_TABLET | Freq: Four times a day (QID) | ORAL | Status: DC | PRN

## 2017-02-11 MED ORDER — MORPHINE SULFATE (CONCENTRATE) 20 MG/ML PO SOLN: 5.0000 mg | ORAL | 0 refills | Status: AC | PRN

## 2017-02-11 MED ORDER — LORAZEPAM 2 MG/ML PO CONC: 1.0000 mg | ORAL | 0 refills | Status: DC | PRN

## 2017-02-11 MED ORDER — LORAZEPAM 2 MG/ML PO CONC: 1.0000 mg | Freq: Three times a day (TID) | ORAL | 0 refills | Status: AC | PRN

## 2017-02-11 NOTE — Care Management Note (Signed)
Case Management Note  Patient Details  Name: Steven Trevino MRN: [id number] Date of Birth: 12-12-1936  Subjective/Objective: Patient chose [facility]-rep Amy assessed, & able to accept-they will manage all dme needed. Will transport home by PTAR.                   Action/Plan:d/c home w/hospice.   Expected Discharge Date:                  Expected Discharge Plan:  Home w Hospice Care  In-House Referral:  Clinical Social Work  Discharge planning Services  CM Consult  Post Acute Care Choice:    Choice offered to:  Spouse  DME Arranged:    DME Agency:     HH Arranged:  RN [place] Agency:  [facility]  Status of Service:  In process, will continue to follow  If discussed at Long Length of Stay Meetings, dates discussed:    Additional Comments:  Dessa Phi, RN 02/11/2017, 8:44 AM

## 2017-02-11 NOTE — Care Management Important Message (Signed)
Important Message  Patient Details  Name: LONZY BABIARZ MRN: [id number] Date of Birth: March 11, 1936   Medicare Important Message Given:  Yes    Kerin Salen 02/11/2017, 12:01 [place] Message  Patient Details  Name: CALEEL JUNGERS MRN: [id number] Date of Birth: 12-Apr-1936   Medicare Important Message Given:  Yes    Kerin Salen 02/11/2017, 12:01 PM

## 2017-02-11 NOTE — Progress Notes (Signed)
OT Cancellation Note  Patient Details Name: KORRY KITTS MRN: [id number] DOB: [birth year]   Cancelled Treatment:    Reason Eval/Treat Not Completed: Other (comment)  Noted plan home with hospice. Spoke with RN. Will sign off    [place], Thereasa Parkin 02/11/2017, 12:59 PM

## 2017-02-11 NOTE — Progress Notes (Addendum)
SLP Cancellation Note  Patient Details Name: HORUS BELLOFATTO MRN: [id number] DOB: [birth year]   Cancelled treatment:       Reason Eval/Treat Not Completed: Attempted swallow evaluation, however fatigue/lethargy limiting ability to participate- awakens briefly but cannot sustain.  D/C home with hospice pending. Wife states swallowing issues are longstanding and that they wax and wane.    Juan Quam Laurice  [id number] 02/11/2017, 12:59 PM

## 2017-02-11 NOTE — Discharge Summary (Signed)
Physician Discharge Summary  Steven Trevino [id number] DOB: Dec 05, 1936 DOA: 02/07/2017  PCP: Imagene Riches, NP  Admit date: 02/07/2017 Discharge date: 02/11/2017  Admitted From: Home Disposition: Home  Recommendations for Outpatient Follow-up:  1. Follow up with PCP in 1-2 weeks 2. Please obtain BMP/CBC in one week  Home Health: NA Equipment/Devices:NA  Discharge Condition: Stable CODE STATUS: DNR Diet recommendation: Diet Carb Modified Fluid consistency: Thin; Room service appropriate? Yes Diet - low sodium heart healthy  Brief/Interim Summary: HPI on 02/07/2017 by Dr. Terald Sleeper Sparksis a 81 y.o.malewith medical history significant of HTN, low iron, DM2, CKD stage V, COPD, some underlying dementia who presents for 2 weeks of weakness and malaise. He and his wife note that about 2 weeks ago he became so weak that he just didn't want to get out of bed. Since that time he has been mainly in bed and only getting up to go to the bathroom. He has had less PO intake, decreased UOP, cough productive of a white sputum for the last 2-3 days, SOB with walking, dizziness and lightheadedness. He has not had a BM in 10 days either, but has no abdominal pain, nausea, vomiting or diarrhea. He denies sick contacts, headache, vision change, focal weakness, travel, chest pain, problems swallowing. He has not been sick like this before. He does have multiple medical problems but has not seen a doctor since April of last year and has not been taking any medications since around that time.   Discharge Diagnoses:  Active Problems:   Stage 4 chronic renal impairment associated with type 2 diabetes mellitus (HCC)   CAP (community acquired pneumonia)   Essential hypertension   Chronic kidney disease (CKD)   Diabetes mellitus without complication ([place])   Community Acquired Pneumonia -Presented with cough, chills, weakness -CXR showed Small right pleural effusion and right basilar  airspace disease which may reflect atelectasis versus pneumonia. Recommended Xray in 3-4 weeks.  -Blood and sputum cultures NGTD, nonreactive HIV, urine negative for Streptococcus antigen. -This is going to check for pneumonia versus aspiration pneumonia.  Goals of care -Palliative care evaluated the patient, after discussion with him and his wife he would go home with hospice. -Burnis Medin go under the care of hospice and palliative care of [facility]. -Given prescription for 30 mL of Roxanol and 30 mL of Lorazepam Intensol.  Dysphagia -Patient appears to have severe dysphagia, he was not able to handle his own secretions well. Continue him. -Patient had severe cough and choking while the nurse tried to give him oral sorbitol. -SLP consulted but swelling eval was not done because of patient lethargy.  Dyspnea with exertion -Possibly related to pneumonia. -Echocardiogram: EF 0000000, grade 1 diastolic dysfunction  Chronic kidney disease, stage IV-IV -Baseline creatinine around 4, upon admission 5.04 -Nephrology consulted and they discussed with the patient and he declined hemodialysis. -Palliative care consulted and after talking to the patient and his wife he'll go home with hospice.  Constipation -Resolved after Dulcolax suppository  Hypertension -Continue amlodipine   Diabetes mellitus, type II  with renal failure -Continue ISS and CBG monitoring  -Hemoglobin A1c 5.8  Tobacco abuse -Discussed smoking cessation -Continue Nicotine patch  Pulmonary nodule -CXR 3 cm nodular opacity in the superior segment of the right upper lobe which may reflect round pneumonia versus pulmonary nodule. Needs repeat CXR in 3-4 weeks.  Possible meningioma -Seen on CT head -MRI brain: 33 x 22 x 36 mm meningioma at the right insula. Mild vasogenic edema  versus asymmetry small-vessel disease in the deformed right superior temporal lobe. -Spoke with Dr. Trenton Gammon, neurosurgery. Recommended  outpatient surveillance and follow up. Unlikely cause of patient's weakness. Probably incidental finding. Did not recommend steroids.   COPD -Duonebs ordered  Anemia -previously with iron deficiency, supposed to be on PO iron -Hemoglobin currently 10.2 -Anemia panel: Iron 17, Ferritin 54 -placed on oral iron   Hypercalcemia -Drinks a gallon of milk every 2 days.  -PTH, Vit D, phosphorus, SPEP, ligh chains pending  ?Dementia -has no formal diagnosis of dementia   Discharge Instructions  Discharge Instructions    Diet - low sodium heart healthy    Complete by:  As directed    Increase activity slowly    Complete by:  As directed      Allergies as of 02/11/2017      Reactions   Feraheme [ferumoxytol] Nausea And Vomiting, Other (See Comments)   Chest pain, back pain, diaphoresis      Medication List    TAKE these medications   cholecalciferol 1000 units tablet Commonly known as:  VITAMIN D Take 1,000 Units by mouth daily.   furosemide 40 MG tablet Commonly known as:  LASIX Take 40 mg by mouth 2 (two) times daily.   insulin aspart 100 UNIT/ML injection Commonly known as:  novoLOG Inject 15 Units into the skin 2 (two) times daily.   IRON PO Take 2 tablets by mouth daily.   LORazepam 2 MG/ML concentrated solution Commonly known as:  LORAZEPAM INTENSOL Take 0.5 mLs (1 mg total) by mouth every 8 (eight) hours as needed for anxiety.   metoprolol tartrate 25 MG tablet Commonly known as:  LOPRESSOR Take 25 mg by mouth 2 (two) times daily.   morphine 20 MG/ML concentrated solution Commonly known as:  ROXANOL Take 0.25 mLs (5 mg total) by mouth every 2 (two) hours as needed for severe pain or shortness of breath.   NORVASC 5 MG tablet Generic drug:  amLODipine Take 5 mg by mouth daily.   promethazine 12.5 MG tablet Commonly known as:  PHENERGAN Take 12.5 mg by mouth 3 (three) times daily as needed for nausea or vomiting.   sulfamethoxazole-trimethoprim  800-160 MG tablet Commonly known as:  BACTRIM DS,SEPTRA DS Take 1 tablet by mouth 2 (two) times daily.   TRESIBA FLEXTOUCH 100 UNIT/ML Sopn FlexTouch Pen Generic drug:  insulin degludec Inject 80 Units into the skin every evening.      Follow-up Information    [facility] at [facility] Follow up.   Specialty:  Hospice and Palliative Medicine Why:  home nurse Contact information: [place] [address] [phone]          Allergies  Allergen Reactions  . Feraheme [Ferumoxytol] Nausea And Vomiting and Other (See Comments)    Chest pain, back pain, diaphoresis    Consultations:  Nephrology.  Palliative care   Procedures (Echo, Carotid, EGD, Colonoscopy, ERCP)   Radiological studies: Dg Chest 2 View  Result Date: 02/07/2017 CLINICAL DATA:  Weakness started this morning with productive cough. EXAM: CHEST  2 VIEW COMPARISON:  10/29/2015 FINDINGS: There is a small right pleural effusion. There is right basilar airspace disease. The left lung is clear. There is no pneumothorax. 3 cm nodular opacity in the superior segment of the right upper lobe. There is no pleural effusion or pneumothorax. The heart and mediastinal contours are unremarkable. The osseous structures are unremarkable. IMPRESSION: 1. Small right pleural effusion and right basilar airspace disease which may reflect  atelectasis versus pneumonia. Followup PA and lateral chest X-ray is recommended in 3-4 weeks following trial of antibiotic therapy to ensure resolution and exclude underlying malignancy. 2. 3 cm nodular opacity in the superior segment of the right upper lobe which may reflect round pneumonia versus pulmonary nodule. Followup PA and lateral chest X-ray is recommended in 3-4 weeks following trial of antibiotic therapy to ensure resolution and exclude underlying malignancy. Electronically Signed   By: Kathreen Devoid   On: 02/07/2017 15:52   Abd 1 View (kub)  Result Date: 02/07/2017 CLINICAL  DATA:  Constipation for 2 weeks. Abdominal pain and bloating. Weakness. History of diabetes and dementia. EXAM: ABDOMEN - 1 VIEW COMPARISON:  None. FINDINGS: Scattered gas and stool throughout the colon. No small or large bowel distention. Calcifications in the left upper quadrant likely represent splenic granulomas. Calcification projected over the right kidney may represent a small renal stone. Vascular calcifications. Degenerative changes in the spine and hips. Pleural thickening or effusion on the right with atelectasis or scarring in the right lung base. IMPRESSION: Nonobstructive bowel gas pattern with stool-filled colon. Atelectasis or scarring in the right lung base with fluid or thickened pleura in the right costophrenic angle. Electronically Signed   By: Lucienne Capers M.D.   On: 02/07/2017 23:02   Ct Head Wo Contrast  Result Date: 02/07/2017 CLINICAL DATA:  Weakness EXAM: CT HEAD WITHOUT CONTRAST TECHNIQUE: Contiguous axial images were obtained from the base of the skull through the vertex without intravenous contrast. COMPARISON:  02/12/2012 FINDINGS: Brain: Mild atrophic changes are noted. Chronic white matter ischemic changes are seen and stable. There a is a 2.1 x 3.5 x 3.8 cm partially calcified lesion arising in the region of the sylvian fissure on the right. It appears to be related to the right sphenoid wing. Vascular: No hyperdense vessel or unexpected calcification. Skull: Normal. Negative for fracture or focal lesion. Sinuses/Orbits: No acute finding. Other: None. IMPRESSION: Mild atrophic and ischemic changes. Partially calcified mass lesion arising in the region of the sylvian fissure on the right. This has the appearance of a meningioma and was not well visualized on the prior exam. Nonemergent MRI would be helpful for confirmation. Electronically Signed   By: Inez Catalina M.D.   On: 02/07/2017 17:46   Mr Brain Wo Contrast  Result Date: 02/08/2017 CLINICAL DATA:  Weakness.  Unable  to ambulate.  Meningioma. EXAM: MRI HEAD WITHOUT CONTRAST TECHNIQUE: Multiplanar, multiecho pulse sequences of the brain and surrounding structures were obtained without intravenous contrast. COMPARISON:  Yesterday FINDINGS: Brain: The partially calcified mass along the right insula is identified is an isointense dural-based lesion measuring 33 x 22 x 36 mm. There is mild to moderate mass effect on the superior temporal lobe and frontal operculum. Asymmetric edematous signal within the white matter of the superior temporal lobe without convincing gyrus swelling. Findings are most consistent with meningioma. Atrophy with ventriculomegaly. Chronic small-vessel disease with confluent ischemic gliosis in the cerebral white matter. Single focus of chronic blood products in the posterior left cerebral hemisphere. Vascular: Asymmetric loss of flow void in the right transverse sigmoid sinus and upper internal jugular vein shows a more normalized appearance on coronal T2 weighted acquisition. No acute thrombosis suspected. Skull and upper cervical spine: No marrow lesion noted. Sinuses/Orbits: Right cataract resection.  No acute finding. Other: Motion degraded study. IMPRESSION: 1. 33 x 22 x 36 mm meningioma at the right insula. Mild vasogenic edema versus asymmetry small-vessel disease in the deformed right superior  temporal lobe. 2. Prominent atrophy and chronic microvascular disease. Electronically Signed   By: Monte Fantasia M.D.   On: 02/08/2017 08:40   Dg Chest Port 1 View  Result Date: 02/10/2017 CLINICAL DATA:  Hypertension.  COPD. EXAM: PORTABLE CHEST 1 VIEW COMPARISON:  02/07/2017.  10/29/2015. FINDINGS: Worsening right lower lobe infiltrate noted. Right -sided pleural effusion noted without significant change. Nodular density is noted in the right upper lobe. This is not changed from prior exam. A pulmonary mass lesion could present this fashion. No pneumothorax. Cardiomegaly with stable pulmonary  vascularity. No acute bony abnormality. IMPRESSION: 1. Worsening infiltrate right lower lobe. Right pleural effusion again noted without significant change. 2. Persistent nodular density in the right upper lobe. Right upper lobe mass lesion cannot be excluded. Contrast-enhanced chest CT may prove useful for further evaluation of this patient. Electronically Signed   By: Marcello Moores  Register   On: 02/10/2017 13:08     Subjective:  Discharge Exam: Vitals:   02/10/17 1935 02/10/17 2059 02/11/17 0615 02/11/17 0711  BP:  (!) 145/55 (!) 185/85   Pulse:  66 71 70  Resp:  18 20 18   Temp:  97.5 F (36.4 C) 97.4 F (36.3 C)   TempSrc:  Axillary Axillary   SpO2: 95% 94% 98% 94%  Weight:   86.9 kg (191 lb 9.3 oz)   Height:       General: Pt is alert, awake, not in acute distress Cardiovascular: RRR, S1/S2 +, no rubs, no gallops Respiratory: CTA bilaterally, no wheezing, no rhonchi Abdominal: Soft, NT, ND, bowel sounds + Extremities: no edema, no cyanosis   The results of significant diagnostics from this hospitalization (including imaging, microbiology, ancillary and laboratory) are listed below for reference.    Microbiology: Recent Results (from the past 240 hour(s))  Culture, blood (routine x 2) Call MD if unable to obtain prior to antibiotics being given     Status: None (Preliminary result)   Collection Time: 02/08/17  1:15 AM  Result Value Ref Range Status   Specimen Description BLOOD LEFT ANTECUBITAL  Final   Special Requests IN PEDIATRIC BOTTLE 2ML  Final   Culture   Final    NO GROWTH 2 DAYS Performed at [facility], [address] [place], [place] [address]    Report Status PENDING  Incomplete  Culture, blood (routine x 2) Call MD if unable to obtain prior to antibiotics being given     Status: None (Preliminary result)   Collection Time: 02/08/17  1:15 AM  Result Value Ref Range Status   Specimen Description BLOOD RIGHT HAND  Final   Special Requests IN PEDIATRIC  BOTTLE 1ML  Final   Culture   Final    NO GROWTH 2 DAYS Performed at [facility], [place] [address] [place], [place] [address]    Report Status PENDING  Incomplete     Labs: BNP (last 3 results) No results for input(s): BNP in the last 8760 hours. Basic Metabolic Panel:  Recent Labs Lab 02/07/17 1533 02/08/17 0102 02/09/17 0528 02/10/17 0617  NA 137 138 139 138  K 4.0 3.9 4.1 4.3  CL 103 103 103 104  CO2 25 24 28 27   GLUCOSE 126* 134* 109* 116*  BUN 37* 38* 39* 39*  CREATININE 5.15* 4.98* 5.04* 5.28*  CALCIUM 11.8* 11.9* 11.8* 12.1*  MG 2.2  --   --   --   PHOS  --   --  4.7*  --  Liver Function Tests: No results for input(s): AST, ALT, ALKPHOS, BILITOT, PROT, ALBUMIN in the last 168 hours. No results for input(s): LIPASE, AMYLASE in the last 168 hours. No results for input(s): AMMONIA in the last 168 hours. CBC:  Recent Labs Lab 02/07/17 1533 02/08/17 0102 02/09/17 0528 02/10/17 0617  WBC 10.5 11.8* 11.0* 9.3  HGB 10.9* 11.1* 10.2* 10.2*  HCT 34.5* 34.8* 33.1* 33.0*  MCV 78.9 78.9 80.1 80.3  PLT 314 321 320 285   Cardiac Enzymes: No results for input(s): CKTOTAL, CKMB, CKMBINDEX, TROPONINI in the last 168 hours. BNP: Invalid input(s): POCBNP CBG:  Recent Labs Lab 02/10/17 0742 02/10/17 1145 02/10/17 1643 02/10/17 2051 02/11/17 0809  GLUCAP 105* 115* 122* 135* 114*   D-Dimer No results for input(s): DDIMER in the last 72 hours. Hgb A1c  Recent Labs  02/08/17 1329  HGBA1C 5.6   Lipid Profile No results for input(s): CHOL, HDL, LDLCALC, TRIG, CHOLHDL, LDLDIRECT in the last 72 hours. Thyroid function studies  Recent Labs  02/09/17 0528  TSH 2.389   Anemia work up No results for input(s): VITAMINB12, FOLATE, FERRITIN, TIBC, IRON, RETICCTPCT in the last 72 hours. Urinalysis    Component Value Date/Time   COLORURINE YELLOW 02/07/2017 1742   APPEARANCEUR CLEAR 02/07/2017 1742   LABSPEC 1.014 02/07/2017 1742   PHURINE 5.0  02/07/2017 1742   GLUCOSEU 150 (A) 02/07/2017 1742   HGBUR NEGATIVE 02/07/2017 1742   BILIRUBINUR NEGATIVE 02/07/2017 1742   KETONESUR NEGATIVE 02/07/2017 1742   PROTEINUR >=300 (A) 02/07/2017 1742   UROBILINOGEN 0.2 06/19/2010 1039   NITRITE NEGATIVE 02/07/2017 1742   LEUKOCYTESUR NEGATIVE 02/07/2017 1742   Sepsis Labs Invalid input(s): PROCALCITONIN,  WBC,  LACTICIDVEN Microbiology Recent Results (from the past 240 hour(s))  Culture, blood (routine x 2) Call MD if unable to obtain prior to antibiotics being given     Status: None (Preliminary result)   Collection Time: 02/08/17  1:15 AM  Result Value Ref Range Status   Specimen Description BLOOD LEFT ANTECUBITAL  Final   Special Requests IN PEDIATRIC BOTTLE 2ML  Final   Culture   Final    NO GROWTH 2 DAYS Performed at [facility], [place] [address] [place], [place] [address]    Report Status PENDING  Incomplete  Culture, blood (routine x 2) Call MD if unable to obtain prior to antibiotics being given     Status: None (Preliminary result)   Collection Time: 02/08/17  1:15 AM  Result Value Ref Range Status   Specimen Description BLOOD RIGHT HAND  Final   Special Requests IN PEDIATRIC BOTTLE 1ML  Final   Culture   Final    NO GROWTH 2 DAYS Performed at [facility], [place] [address], [place], [place] [address]    Report Status PENDING  Incomplete     Time coordinating discharge: Over 30 minutes  SIGNED:   Birdie Hopes, MD  [facility] 02/11/2017, 11:47 AM Pager   If 7PM-7AM, please contact night-coverage [email or web address] Password [email or web address]

## 2017-02-11 NOTE — Progress Notes (Signed)
Daily Progress Note   Patient Name: Steven Trevino       Date: 02/11/2017 DOB: 07-05-1936  Age: 81 y.o. MRN#: [id number] Attending Physician: Verlee Monte, MD Primary Care Physician: Imagene Riches, NP Admit Date: 02/07/2017  Reason for Consultation/Follow-up: Disposition, Non pain symptom management and Pain control regarding end-stage kidney disease without pursuit of hemodialysis.  Subjective: Steven Trevino is lying in bed when we arrive, dozing in and out of sleep with his wife, Steven Trevino, standing by his side.  She tells [place] he hasn't had a good night's sleep since being admitted, and attributes that to his now restless demeanor.  When asked directly, Steven Trevino denies pain and states he feels better than yesterday, but Steven Trevino explains that he asked for pain medication an hour ago. He does appear lethargic, restless, and extremely fatigued.  Both his symptoms and apparent denial of symptoms may stem from his eagerness to return home, as is planned at discharge later today.  Steven Trevino tells [place] he doesn't normally take medication to sleep at home, but would accept a prescription for a sleep aid (in addition to pain medication) upon discharge.  Steven Trevino tells [place] that she spoke with the hospice representative on the phone yesterday afternoon to establish care and discuss home visits.  Neither she nor Steven Trevino have questions at this time.   Patient Profile/HPI: 81 y.o. male  with past medical history of chronic kidney disease stage IV-V, COPD, DM, sleep apnea who was admitted on 02/07/2017 with weakness.  Mr. Raffetto and his wife noticed weakness for about 2 weeks prior to this admission, which resulted in being mainly confined to bed.  This is a new complaint as he denies prior symptoms of this nature.   He had a CXR which showed a right sided pleural effusion with possible pneumonia and a right upper lobe pulmonary nodule. Cr was up to 5.15 from a baseline around 4, BUN of 37. UA did not show any signs of infection. Nephrology consulted, in process of determining whether to pursue hemodialysis.  MRI head showed meningioma of approximately 3cm with mass affect.  Neurosurgery plans to follow as outpatient, however this may change due to hospice involvement.    Length of Stay: 4  Current Medications: Scheduled Meds:  . azithromycin  500 mg Oral QHS  . cefTRIAXone (  ROCEPHIN)  IV  1 g Intravenous Q24H  . docusate sodium  100 mg Oral BID  . ferrous sulfate  325 mg Oral BID WC  . heparin  5,000 Units Subcutaneous Q8H  . insulin aspart  0-5 Units Subcutaneous QHS  . insulin aspart  0-9 Units Subcutaneous TID WC  . ipratropium-albuterol  3 mL Nebulization TID  . metoprolol tartrate  25 mg Oral BID  . nicotine  14 mg Transdermal Daily  . polyethylene glycol  17 g Oral Daily  . sorbitol  60 mL Oral Once    Continuous Infusions:   PRN Meds: acetaminophen **OR** acetaminophen, bisacodyl, hydrALAZINE, LORazepam, ondansetron **OR** ondansetron (ZOFRAN) IV  Physical Exam  Well developed, Pale man, lying in bed, mildly agitated, awake, confused.  Vital Signs: BP (!) 185/85 (BP Location: Left Arm)   Pulse 70   Temp 97.4 F (36.3 C) (Axillary)   Resp 18   Ht 5\' 9"  (1.753 m)   Wt 86.9 kg (191 lb 9.3 oz)   SpO2 94%   BMI 28.29 kg/m  SpO2: SpO2: 94 % O2 Device: O2 Device: Not Delivered O2 Flow Rate:    Intake/output summary:  Intake/Output Summary (Last 24 hours) at 02/11/17 1252 Last data filed at 02/10/17 1512  Gross per 24 hour  Intake                0 ml  Output              100 ml  Net             -100 ml   LBM: Last BM Date: 02/10/17 Baseline Weight: Weight: 90.9 kg (200 lb 6.4 oz) Most recent weight: Weight: 86.9 kg (191 lb 9.3 oz)       Palliative  Assessment/Data:    Flowsheet Rows   Flowsheet Row Most Recent Value  Intake Tab  Referral Department  Hospitalist  Unit at Time of Referral  Med/Surg Unit  Palliative Care Primary Diagnosis  Nephrology  Date Notified  02/09/17  Palliative Care Type  New Palliative care  Reason for referral  Clarify Goals of Care  Date of Admission  02/09/17  Date first seen by Palliative Care  02/10/17  # of days Palliative referral response time  1 Day(s)  # of days IP prior to Palliative referral  0  Clinical Assessment  Palliative Performance Scale Score  40%  Psychosocial & Spiritual Assessment  Palliative Care Outcomes  Patient/Family meeting held?  Yes  Who was at the meeting?  patient and his wife, Steven Trevino CPR status, Completed durable DNR, Changed to focus on comfort, Counseled regarding hospice, Clarified goals of care  Patient/Family wishes: Interventions discontinued/not started   Hemodialysis, Mechanical Ventilation      Patient Active Problem List   Diagnosis Date Noted  . CAP (community acquired pneumonia) 02/07/2017  . Essential hypertension   . Chronic kidney disease (CKD)   . Diabetes mellitus without complication ([place])   . Constipation   . Weakness   . Stage 4 chronic renal impairment associated with type 2 diabetes mellitus ([place]) 03/24/2016    Palliative Care Plan    Recommendations/Plan:  Patient will be discharged to home with hospice care today  Agitation/insomnia/pain:  ativan and roxanol scripts to be sent with patient at d/c per Dr. Hartford Poli  Goals of Care and Additional Recommendations:  Limitations on Scope of Treatment: Full Comfort Care  Code Status:  DNR  Prognosis:   <  3 months due to chronic kidney disease stage IV-V  Discharge Planning:  Home with Hospice  Care plan was discussed with patient and patient's wife  Thank you for allowing the Palliative Medicine Team to assist in the care of this  patient.  Total time spent:   15 minutes     Greater than 50%  of this time was spent counseling and coordinating care related to the above assessment and plan.  Olene Floss, PA-S Imogene Burn, [place] [facility] Pager: [phone]   Please contact Palliative MedicineTeam phone at [phone] for questions and concerns between 7 am - 7 pm.   Please see AMION for individual provider pager numbers.

## 2017-02-11 NOTE — Progress Notes (Signed)
[facility] Liaison:  Spoke with Steven Trevino, wife, she advised that [facility] had not yet contacted her.  I called [facility] and they advised that they did not get her mobile number.  I provided to them her mobile number of [phone] and they advised would contact ASAP to get delivery set up for today.  Thank you,  Edyth Gunnels, RN, [facility] Liaison [phone]  All hospital liaison's are now on [place].  Please feel free to contact me directly or call hospice [phone] after 5pm.

## 2017-02-11 NOTE — Progress Notes (Signed)
CSW reviewed chart indicating that discharge plan is now home with hospice.   No further CSW needs identified - CSW signing off.   Raynaldo Opitz, [facility] Clinical Social Worker cell #: [phone]

## 2017-02-13 LAB — CULTURE, BLOOD (ROUTINE X 2)
Culture: NO GROWTH
Culture: NO GROWTH

## 2017-02-21 NOTE — ED Provider Notes (Signed)
[facility] DEPT Provider Note   CSN: [id number] Arrival date & time: 02/07/17  1429     History   Chief Complaint Chief Complaint  Patient presents with  . Weakness    HPI MICIAH LEICHTMAN is a 81 y.o. male.  HPI   81 y.o. male with medical history significant of HTN, low iron, DM2, CKD stage V, COPD, some underlying dementia who presents for 2 weeks of weakness and malaise.  He and his wife note that about 2 weeks ago he became so weak that he just didn't want to get out of bed.  Since that time he has been mainly in bed and only getting up to go to the bathroom.  He has had less PO intake, decreased UOP, cough productive of a white sputum for the last 2-3 days, SOB with walking, dizziness and lightheadedness.    Past Medical History:  Diagnosis Date  . Anemia    low iron  . Cancer ([place])    skin cancer  . Chronic kidney disease (CKD)    Stage 4  . COPD (chronic obstructive pulmonary disease) ([place])   . Diabetes mellitus without complication ([place])    type 2  . History of hiatal hernia    hx of   . Hypertension   . Iron deficiency   . Pneumonia   . Renal disorder    One Kidney  . Shortness of breath dyspnea    with exertion  . Sleep apnea    does not use cpap    Patient Active Problem List   Diagnosis Date Noted  . Palliative care encounter   . Goals of care, counseling/discussion   . Encounter for hospice care discussion   . Agitation   . CAP (community acquired pneumonia) 02/07/2017  . Essential hypertension   . Chronic kidney disease (CKD)   . Diabetes mellitus without complication ([place])   . Constipation   . Weakness   . Stage 4 chronic renal impairment associated with type 2 diabetes mellitus ([place]) 03/24/2016    Past Surgical History:  Procedure Laterality Date  . COLONOSCOPY    . EYE SURGERY     cataract surgery - one eye  . KIDNEY SURGERY Left    kidney removed       Home Medications    Prior to Admission medications   Medication Sig  Start Date End Date Taking? Authorizing Provider  amLODipine (NORVASC) 5 MG tablet Take 5 mg by mouth daily.    Historical Provider, MD  cholecalciferol (VITAMIN D) 1000 UNITS tablet Take 1,000 Units by mouth daily.    Historical Provider, MD  furosemide (LASIX) 40 MG tablet Take 40 mg by mouth 2 (two) times daily.     Historical Provider, MD  insulin aspart (NOVOLOG) 100 UNIT/ML injection Inject 15 Units into the skin 2 (two) times daily.    Historical Provider, MD  Insulin Degludec (TRESIBA FLEXTOUCH) 100 UNIT/ML SOPN Inject 80 Units into the skin every evening.    Historical Provider, MD  IRON PO Take 2 tablets by mouth daily.    Historical Provider, MD  LORazepam (LORAZEPAM INTENSOL) 2 MG/ML concentrated solution Take 0.5 mLs (1 mg total) by mouth every 8 (eight) hours as needed for anxiety. 02/11/17   Verlee Monte, MD  metoprolol tartrate (LOPRESSOR) 25 MG tablet Take 25 mg by mouth 2 (two) times daily.    Historical Provider, MD  morphine (ROXANOL) 20 MG/ML concentrated solution Take 0.25 mLs (5 mg total) by mouth every  2 (two) hours as needed for severe pain or shortness of breath. 02/11/17   Verlee Monte, MD  promethazine (PHENERGAN) 12.5 MG tablet Take 12.5 mg by mouth 3 (three) times daily as needed for nausea or vomiting.    Historical Provider, MD  sulfamethoxazole-trimethoprim (BACTRIM DS,SEPTRA DS) 800-160 MG tablet Take 1 tablet by mouth 2 (two) times daily.    Historical Provider, MD    Family History Family History  Problem Relation Age of Onset  . Diabetes Mellitus II Mother     Social History Social History  Substance Use Topics  . Smoking status: Current Every Day Smoker    Packs/day: 2.00    Years: 60.00    Types: Cigarettes  . Smokeless tobacco: Former Systems developer  . Alcohol use No     Allergies   Feraheme [ferumoxytol]   Review of Systems Review of Systems   Physical Exam Updated Vital Signs BP (!) 165/75 (BP Location: Right Arm)   Pulse 72   Temp 98.4 F  (36.9 C) (Oral)   Resp 17   Ht 5\' 9"  (1.753 m)   Wt 191 lb 9.3 oz (86.9 kg)   SpO2 97%   BMI 28.29 kg/m   Physical Exam  Constitutional: He appears well-developed and well-nourished. No distress.  Laying in bed. Appears tired, but not distressed.   HENT:  Head: Normocephalic and atraumatic.  Eyes: Conjunctivae are normal. Right eye exhibits no discharge. Left eye exhibits no discharge.  Neck: Neck supple.  Cardiovascular: Normal rate, regular rhythm and normal heart sounds.  Exam reveals no gallop and no friction rub.   No murmur heard. Pulmonary/Chest: Effort normal and breath sounds normal. No respiratory distress.  Abdominal: Soft. He exhibits no distension. There is no tenderness.  Musculoskeletal: He exhibits no edema or tenderness.  Neurological: He is alert.  Skin: Skin is warm and dry.  Psychiatric: He has a normal mood and affect. His behavior is normal. Thought content normal.  Nursing note and vitals reviewed.    ED Treatments / Results  Labs (all labs ordered are listed, but only abnormal results are displayed) Labs Reviewed  BASIC METABOLIC PANEL - Abnormal; Notable for the following:       Result Value   Glucose, Bld 126 (*)    BUN 37 (*)    Creatinine, Ser 5.15 (*)    Calcium 11.8 (*)    GFR calc non Af Amer 10 (*)    GFR calc Af Amer 11 (*)    All other components within normal limits  CBC - Abnormal; Notable for the following:    Hemoglobin 10.9 (*)    HCT 34.5 (*)    MCH 24.9 (*)    All other components within normal limits  URINALYSIS, ROUTINE W REFLEX MICROSCOPIC - Abnormal; Notable for the following:    Glucose, UA 150 (*)    Protein, ur >=300 (*)    Bacteria, UA RARE (*)    Squamous Epithelial / LPF 0-5 (*)    All other components within normal limits  HEMOGLOBIN A1C - Abnormal; Notable for the following:    Hgb A1c MFr Bld 5.8 (*)    All other components within normal limits  CBC - Abnormal; Notable for the following:    WBC 11.8 (*)     Hemoglobin 11.1 (*)    HCT 34.8 (*)    MCH 25.2 (*)    All other components within normal limits  IRON AND TIBC - Abnormal; Notable for the following:  Iron 17 (*)    TIBC 237 (*)    Saturation Ratios 7 (*)    All other components within normal limits  BASIC METABOLIC PANEL - Abnormal; Notable for the following:    Glucose, Bld 134 (*)    BUN 38 (*)    Creatinine, Ser 4.98 (*)    Calcium 11.9 (*)    GFR calc non Af Amer 10 (*)    GFR calc Af Amer 12 (*)    All other components within normal limits  GLUCOSE, CAPILLARY - Abnormal; Notable for the following:    Glucose-Capillary 132 (*)    All other components within normal limits  GLUCOSE, CAPILLARY - Abnormal; Notable for the following:    Glucose-Capillary 113 (*)    All other components within normal limits  GLUCOSE, CAPILLARY - Abnormal; Notable for the following:    Glucose-Capillary 131 (*)    All other components within normal limits  CBC - Abnormal; Notable for the following:    WBC 11.0 (*)    RBC 4.13 (*)    Hemoglobin 10.2 (*)    HCT 33.1 (*)    MCH 24.7 (*)    All other components within normal limits  BASIC METABOLIC PANEL - Abnormal; Notable for the following:    Glucose, Bld 109 (*)    BUN 39 (*)    Creatinine, Ser 5.04 (*)    Calcium 11.8 (*)    GFR calc non Af Amer 10 (*)    GFR calc Af Amer 11 (*)    All other components within normal limits  PARATHYROID HORMONE, INTACT (NO CA) - Abnormal; Notable for the following:    PTH 10 (*)    All other components within normal limits  GLUCOSE, CAPILLARY - Abnormal; Notable for the following:    Glucose-Capillary 141 (*)    All other components within normal limits  GLUCOSE, CAPILLARY - Abnormal; Notable for the following:    Glucose-Capillary 103 (*)    All other components within normal limits  GLUCOSE, CAPILLARY - Abnormal; Notable for the following:    Glucose-Capillary 127 (*)    All other components within normal limits  GLUCOSE, CAPILLARY - Abnormal;  Notable for the following:    Glucose-Capillary 106 (*)    All other components within normal limits  KAPPA/LAMBDA LIGHT CHAINS - Abnormal; Notable for the following:    Kappa free light chain 99.1 (*)    Lamda free light chains 79.6 (*)    All other components within normal limits  PHOSPHORUS - Abnormal; Notable for the following:    Phosphorus 4.7 (*)    All other components within normal limits  PROTEIN ELECTROPHORESIS, SERUM - Abnormal; Notable for the following:    Total Protein ELP 5.7 (*)    Albumin ELP 2.6 (*)    All other components within normal limits  CBC - Abnormal; Notable for the following:    RBC 4.11 (*)    Hemoglobin 10.2 (*)    HCT 33.0 (*)    MCH 24.8 (*)    All other components within normal limits  BASIC METABOLIC PANEL - Abnormal; Notable for the following:    Glucose, Bld 116 (*)    BUN 39 (*)    Creatinine, Ser 5.28 (*)    Calcium 12.1 (*)    GFR calc non Af Amer 9 (*)    GFR calc Af Amer 11 (*)    All other components within normal limits  GLUCOSE, CAPILLARY - Abnormal;  Notable for the following:    Glucose-Capillary 119 (*)    All other components within normal limits  GLUCOSE, CAPILLARY - Abnormal; Notable for the following:    Glucose-Capillary 133 (*)    All other components within normal limits  GLUCOSE, CAPILLARY - Abnormal; Notable for the following:    Glucose-Capillary 105 (*)    All other components within normal limits  GLUCOSE, CAPILLARY - Abnormal; Notable for the following:    Glucose-Capillary 115 (*)    All other components within normal limits  GLUCOSE, CAPILLARY - Abnormal; Notable for the following:    Glucose-Capillary 122 (*)    All other components within normal limits  GLUCOSE, CAPILLARY - Abnormal; Notable for the following:    Glucose-Capillary 135 (*)    All other components within normal limits  GLUCOSE, CAPILLARY - Abnormal; Notable for the following:    Glucose-Capillary 114 (*)    All other components within  normal limits  CBG MONITORING, ED - Abnormal; Notable for the following:    Glucose-Capillary 126 (*)    All other components within normal limits  CULTURE, BLOOD (ROUTINE X 2)  CULTURE, BLOOD (ROUTINE X 2)  MAGNESIUM  HIV ANTIBODY (ROUTINE TESTING)  STREP PNEUMONIAE URINARY ANTIGEN  INFLUENZA PANEL BY PCR (TYPE A & B)  FERRITIN  HEMOGLOBIN A1C  TSH  VITAMIN D 25 HYDROXY (VIT D DEFICIENCY, FRACTURES)    EKG  EKG Interpretation  Date/Time:  Sunday February 07 2017 14:59:13 EST Ventricular Rate:  77 PR Interval:    QRS Duration: 172 QT Interval:  428 QTC Calculation: 485 R Axis:   -9 Text Interpretation:  Sinus rhythm Right bundle branch block Similar to prior. No STEMI.  Confirmed by LONG MD, JOSHUA [phone]) on 02/07/2017 3:05:31 PM       Radiology No results found.   Dg Chest 2 View  Result Date: 02/07/2017 CLINICAL DATA:  Weakness started this morning with productive cough. EXAM: CHEST  2 VIEW COMPARISON:  10/29/2015 FINDINGS: There is a small right pleural effusion. There is right basilar airspace disease. The left lung is clear. There is no pneumothorax. 3 cm nodular opacity in the superior segment of the right upper lobe. There is no pleural effusion or pneumothorax. The heart and mediastinal contours are unremarkable. The osseous structures are unremarkable. IMPRESSION: 1. Small right pleural effusion and right basilar airspace disease which may reflect atelectasis versus pneumonia. Followup PA and lateral chest X-ray is recommended in 3-4 weeks following trial of antibiotic therapy to ensure resolution and exclude underlying malignancy. 2. 3 cm nodular opacity in the superior segment of the right upper lobe which may reflect round pneumonia versus pulmonary nodule. Followup PA and lateral chest X-ray is recommended in 3-4 weeks following trial of antibiotic therapy to ensure resolution and exclude underlying malignancy. Electronically Signed   By: Kathreen Devoid   On: 02/07/2017  15:52   Abd 1 View (kub)  Result Date: 02/07/2017 CLINICAL DATA:  Constipation for 2 weeks. Abdominal pain and bloating. Weakness. History of diabetes and dementia. EXAM: ABDOMEN - 1 VIEW COMPARISON:  None. FINDINGS: Scattered gas and stool throughout the colon. No small or large bowel distention. Calcifications in the left upper quadrant likely represent splenic granulomas. Calcification projected over the right kidney may represent a small renal stone. Vascular calcifications. Degenerative changes in the spine and hips. Pleural thickening or effusion on the right with atelectasis or scarring in the right lung base. IMPRESSION: Nonobstructive bowel gas pattern with stool-filled colon. Atelectasis  or scarring in the right lung base with fluid or thickened pleura in the right costophrenic angle. Electronically Signed   By: Lucienne Capers M.D.   On: 02/07/2017 23:02   Ct Head Wo Contrast  Result Date: 02/07/2017 CLINICAL DATA:  Weakness EXAM: CT HEAD WITHOUT CONTRAST TECHNIQUE: Contiguous axial images were obtained from the base of the skull through the vertex without intravenous contrast. COMPARISON:  02/12/2012 FINDINGS: Brain: Mild atrophic changes are noted. Chronic white matter ischemic changes are seen and stable. There a is a 2.1 x 3.5 x 3.8 cm partially calcified lesion arising in the region of the sylvian fissure on the right. It appears to be related to the right sphenoid wing. Vascular: No hyperdense vessel or unexpected calcification. Skull: Normal. Negative for fracture or focal lesion. Sinuses/Orbits: No acute finding. Other: None. IMPRESSION: Mild atrophic and ischemic changes. Partially calcified mass lesion arising in the region of the sylvian fissure on the right. This has the appearance of a meningioma and was not well visualized on the prior exam. Nonemergent MRI would be helpful for confirmation. Electronically Signed   By: Inez Catalina M.D.   On: 02/07/2017 17:46   Mr Brain Wo  Contrast  Result Date: 02/08/2017 CLINICAL DATA:  Weakness.  Unable to ambulate.  Meningioma. EXAM: MRI HEAD WITHOUT CONTRAST TECHNIQUE: Multiplanar, multiecho pulse sequences of the brain and surrounding structures were obtained without intravenous contrast. COMPARISON:  Yesterday FINDINGS: Brain: The partially calcified mass along the right insula is identified is an isointense dural-based lesion measuring 33 x 22 x 36 mm. There is mild to moderate mass effect on the superior temporal lobe and frontal operculum. Asymmetric edematous signal within the white matter of the superior temporal lobe without convincing gyrus swelling. Findings are most consistent with meningioma. Atrophy with ventriculomegaly. Chronic small-vessel disease with confluent ischemic gliosis in the cerebral white matter. Single focus of chronic blood products in the posterior left cerebral hemisphere. Vascular: Asymmetric loss of flow void in the right transverse sigmoid sinus and upper internal jugular vein shows a more normalized appearance on coronal T2 weighted acquisition. No acute thrombosis suspected. Skull and upper cervical spine: No marrow lesion noted. Sinuses/Orbits: Right cataract resection.  No acute finding. Other: Motion degraded study. IMPRESSION: 1. 33 x 22 x 36 mm meningioma at the right insula. Mild vasogenic edema versus asymmetry small-vessel disease in the deformed right superior temporal lobe. 2. Prominent atrophy and chronic microvascular disease. Electronically Signed   By: Monte Fantasia M.D.   On: 02/08/2017 08:40   Procedures Procedures (including critical care time)  Medications Ordered in ED Medications  0.9 %  sodium chloride infusion ( Intravenous Stopped 02/09/17 0006)  cefTRIAXone (ROCEPHIN) 1 g in dextrose 5 % 50 mL IVPB (0 g Intravenous Stopped 02/07/17 1840)  Influenza vac split quadrivalent PF (FLUARIX) injection 0.5 mL (0.5 mLs Intramuscular Given 02/10/17 1004)  pneumococcal 23 valent vaccine  (PNU-IMMUNE) injection 0.5 mL (0.5 mLs Intramuscular Given 02/10/17 1006)  bisacodyl (DULCOLAX) suppository 10 mg (10 mg Rectal Given 02/10/17 1802)     Initial Impression / Assessment and Plan / ED Course  I have reviewed the triage vital signs and the nursing notes.  Pertinent labs & imaging results that were available during my care of the patient were reviewed by me and considered in my medical decision making (see chart for details).      Final Clinical Impressions(s) / ED Diagnoses   Final diagnoses:  Essential hypertension  Stage 5 chronic kidney disease  not on chronic dialysis ([place])  Diabetes mellitus without complication ([place])  Constipation  Weakness  Meningioma ([facility])  S/p nephrectomy  SOB (shortness of breath)    New Prescriptions Discharge Medication List as of 02/11/2017  3:01 PM    START taking these medications   Details  LORazepam (LORAZEPAM INTENSOL) 2 MG/ML concentrated solution Take 0.5 mLs (1 mg total) by mouth every 8 (eight) hours as needed for anxiety., Starting Thu 02/11/2017, Print    morphine (ROXANOL) 20 MG/ML concentrated solution Take 0.25 mLs (5 mg total) by mouth every 2 (two) hours as needed for severe pain or shortness of breath., Starting Thu 02/11/2017, Print         Virgel Manifold, MD 02/21/17 2109

## 2017-02-25 DEATH — deceased

## 2017-03-02 IMAGING — MR MR HEAD W/O CM
8 of 10 series · 35 of 48 positions shown · non-contrast
Comparison: Yesterday

CLINICAL DATA: Weakness.  Unable to ambulate.  Meningioma.

EXAM:
MRI HEAD WITHOUT CONTRAST
TECHNIQUE: Multiplanar, multiecho pulse sequences of the brain and surrounding
structures were obtained without intravenous contrast.

[Series 3: DWI · axial · 3.0mm · 1.09mm/px · z∈[+6,+151]mm · 8 of 100 slices shown (1 of 4)]
[im 1/100]
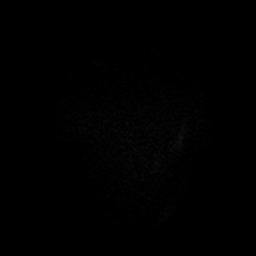
[im 12/100]
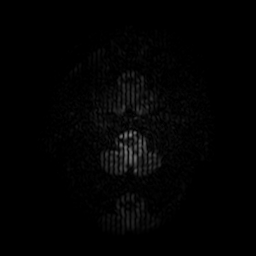
[im 34/100]
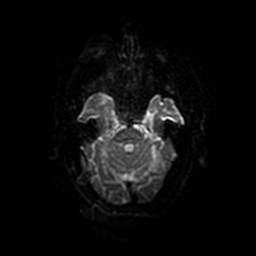
[im 45/100]
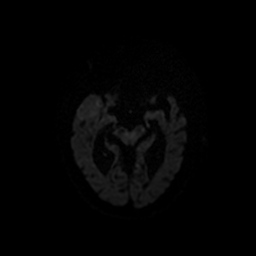
[im 56/100]
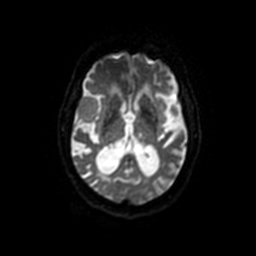
[im 67/100]
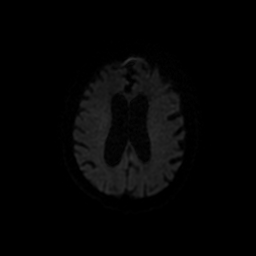
[im 89/100]
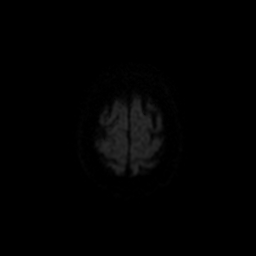
[im 100/100]
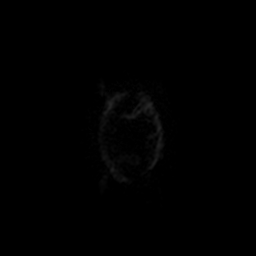

[Series 4: DWI · coronal · 5.0mm · 1.09mm/px · 6 of 64 slices shown (2 of 4)]
[im 1/64]
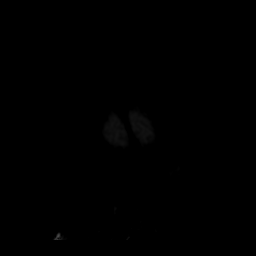
[im 13/64]
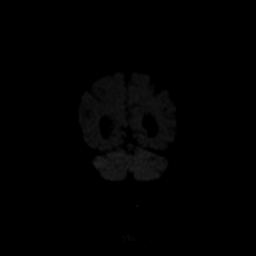
[im 26/64]
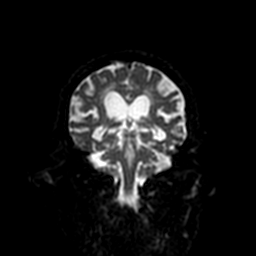
[im 38/64]
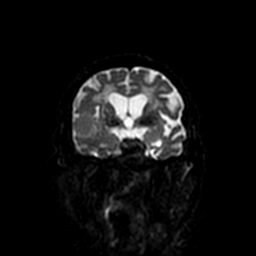
[im 51/64]
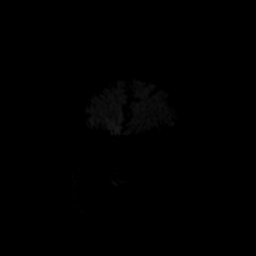
[im 64/64]
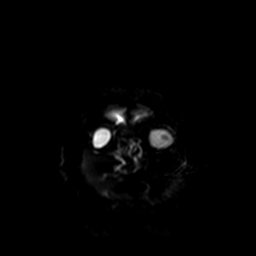

[Series 5: T1 · sagittal · 5.0mm · 0.47mm/px · 2 of 24 slices shown]
[im 1/24]
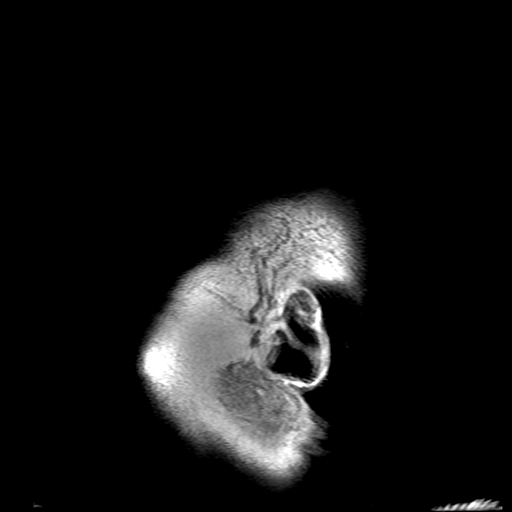
[im 12/24]
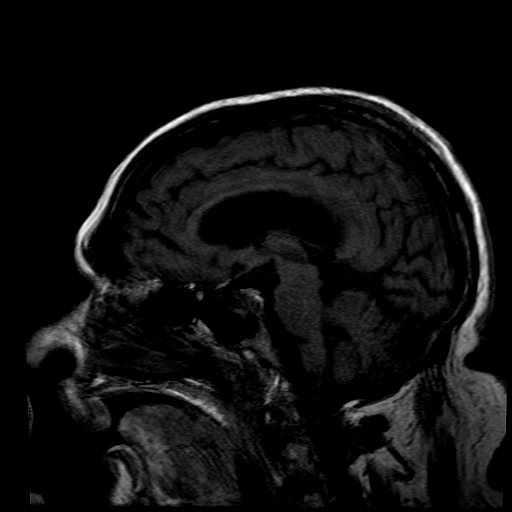

[Series 6: T2 · axial · 5.0mm · 0.43mm/px · z∈[-5,+151]mm · 3 of 28 slices shown (1 of 2)]
[im 1/28]
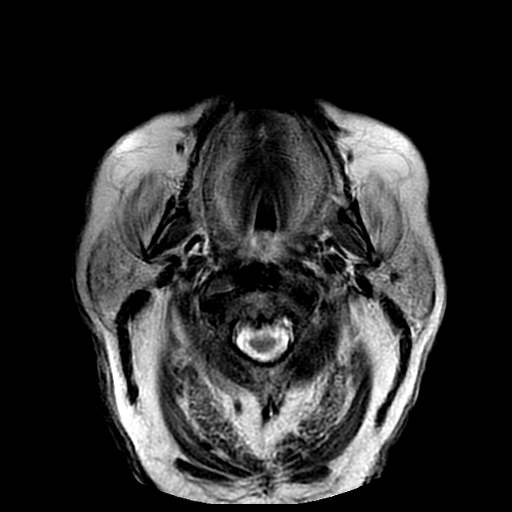
[im 14/28]
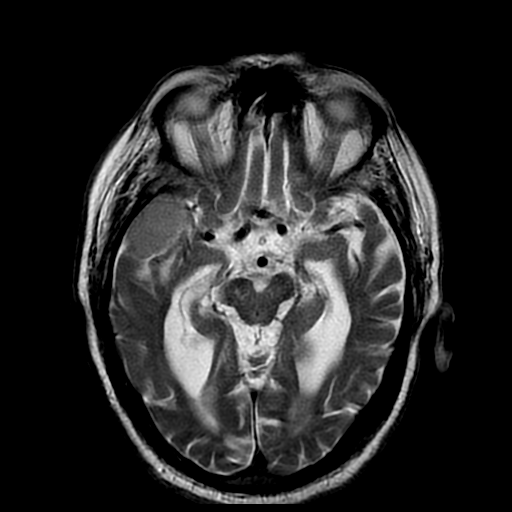
[im 28/28]
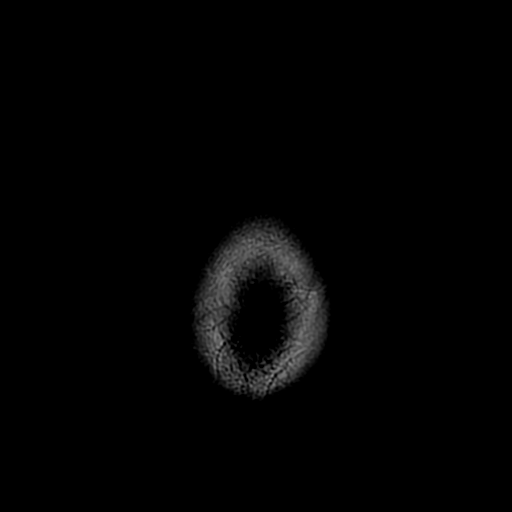

[Series 7: FLAIR · axial · 5.0mm · 0.43mm/px · z∈[-5,+151]mm · 3 of 28 slices shown]
[im 1/28]
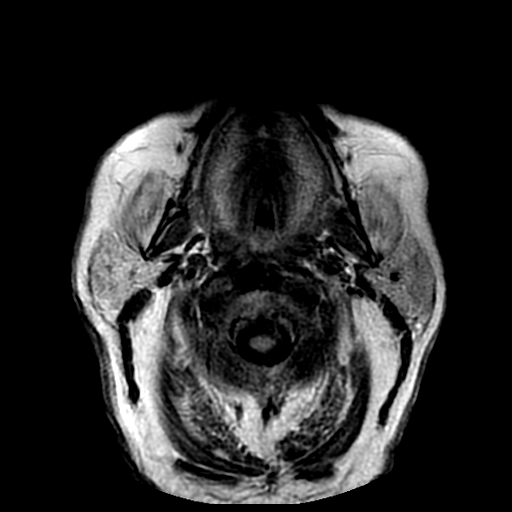
[im 14/28]
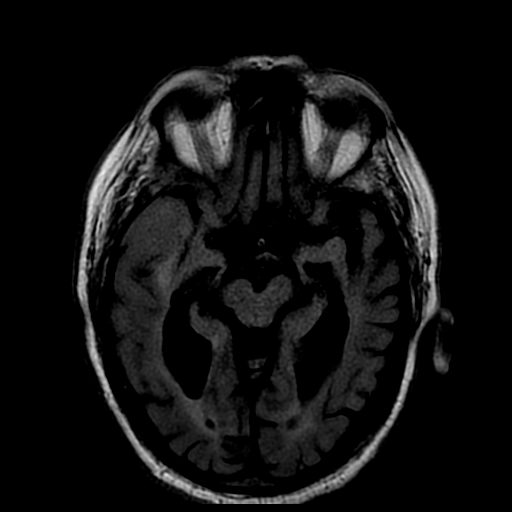
[im 28/28]
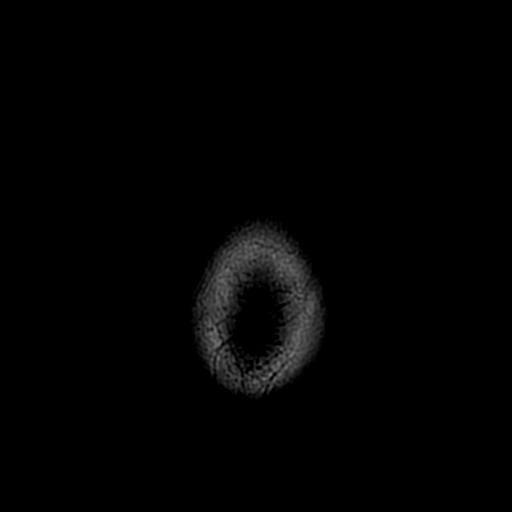

[Series 10: T2 · coronal · 5.0mm · 0.47mm/px · 3 of 26 slices shown (2 of 2)]
[im 1/26]
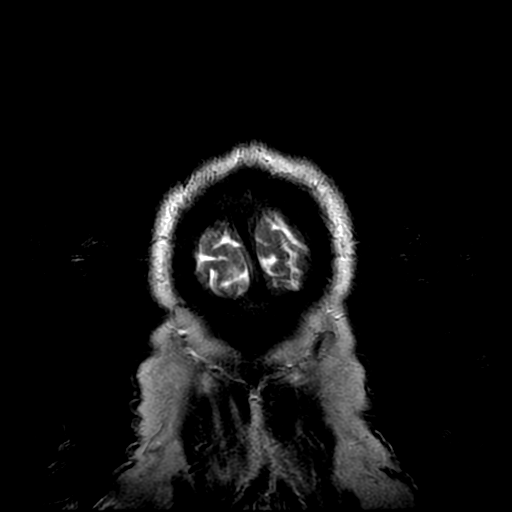
[im 13/26]
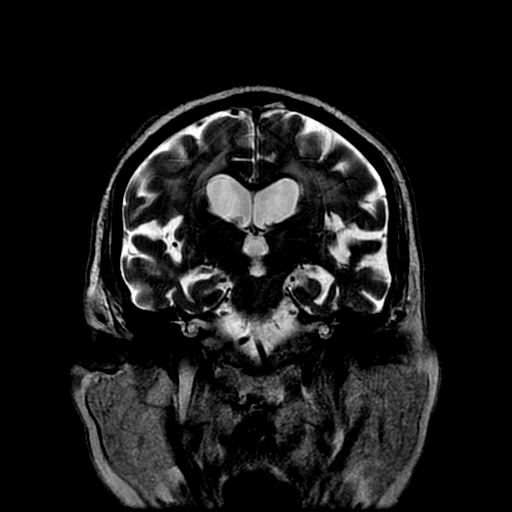
[im 26/26]
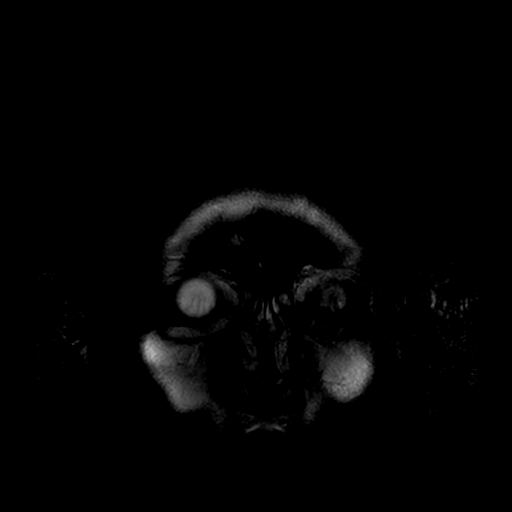

[Series 300: DWI · axial · 3.0mm · 1.09mm/px · z∈[+6,+151]mm · 6 of 50 slices shown (3 of 4)]
[im 1/50]
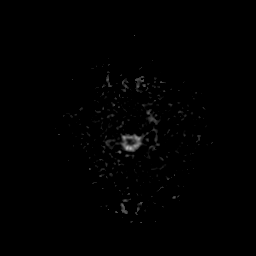
[im 10/50]
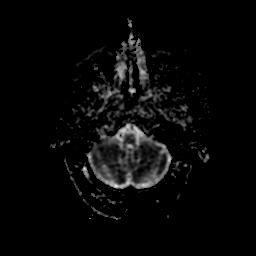
[im 20/50]
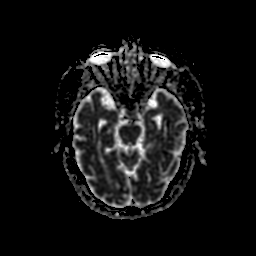
[im 30/50]
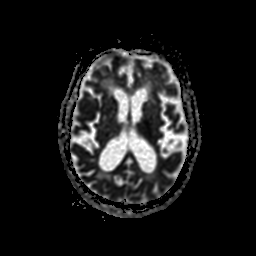
[im 40/50]
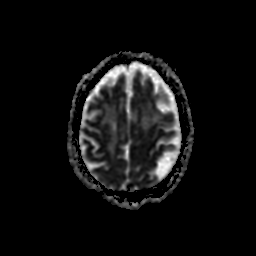
[im 50/50]
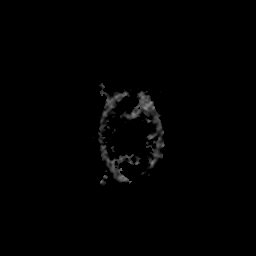

[Series 400: DWI · coronal · 5.0mm · 1.09mm/px · 4 of 32 slices shown (4 of 4)]
[im 1/32]
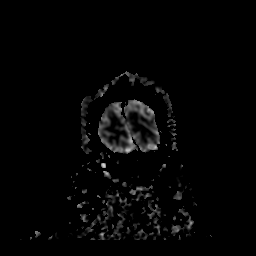
[im 11/32]
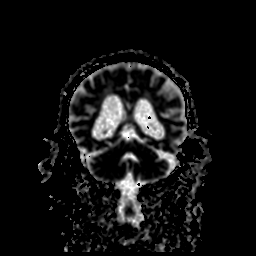
[im 21/32]
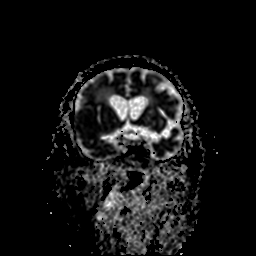
[im 32/32]
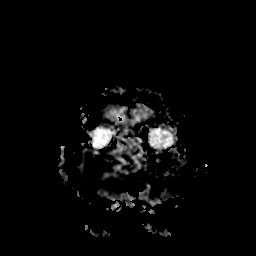

[35 of 48 positions shown; findings below may reference images not displayed]

FINDINGS: Brain: The partially calcified mass along the right insula is
identified is an isointense dural-based lesion measuring 33 x 22 x
36 mm. There is mild to moderate mass effect on the superior
temporal lobe and frontal operculum. Asymmetric edematous signal
within the white matter of the superior temporal lobe without
convincing gyrus swelling. Findings are most consistent with
meningioma.

Atrophy with ventriculomegaly. Chronic small-vessel disease with
confluent ischemic gliosis in the cerebral white matter. Single
focus of chronic blood products in the posterior left cerebral
hemisphere.

Vascular: Asymmetric loss of flow void in the right transverse
sigmoid sinus and upper internal jugular vein shows a more
normalized appearance on coronal T2 weighted acquisition. No acute
thrombosis suspected.

Skull and upper cervical spine: No marrow lesion noted.

Sinuses/Orbits: Right cataract resection.  No acute finding.

Other: Motion degraded study.
IMPRESSION: 1. 33 x 22 x 36 mm meningioma at the right insula. Mild vasogenic
edema versus asymmetry small-vessel disease in the deformed right
superior temporal lobe.
2. Prominent atrophy and chronic microvascular disease.
